# Patient Record
Sex: Male | Born: 1960 | Race: White | Hispanic: No | Marital: Married | Smoking: Never smoker
Health system: Southern US, Community
[De-identification: ages and names within clinical notes are randomized; demographics above are authoritative.]

## PROBLEM LIST (undated history)

## (undated) DIAGNOSIS — I428 Other cardiomyopathies: Principal | ICD-10-CM

## (undated) DIAGNOSIS — Z789 Other specified health status: Secondary | ICD-10-CM

## (undated) DIAGNOSIS — C61 Malignant neoplasm of prostate: Secondary | ICD-10-CM

## (undated) DIAGNOSIS — K219 Gastro-esophageal reflux disease without esophagitis: Secondary | ICD-10-CM

## (undated) HISTORY — PX: PROSTATE BIOPSY: SHX241

## (undated) HISTORY — DX: Other cardiomyopathies: I42.8

## (undated) HISTORY — PX: VASECTOMY: SHX75

---

## 1999-12-20 HISTORY — PX: VASECTOMY REVERSAL: SHX243

## 2000-08-01 ENCOUNTER — Encounter: Admission: RE | Admit: 2000-08-01 | Discharge: 2000-08-01 | Payer: Self-pay | Admitting: Occupational Medicine

## 2000-08-01 ENCOUNTER — Encounter: Payer: Self-pay | Admitting: Occupational Medicine

## 2002-10-09 ENCOUNTER — Encounter: Payer: Self-pay | Admitting: Family Medicine

## 2002-10-09 ENCOUNTER — Encounter: Admission: RE | Admit: 2002-10-09 | Discharge: 2002-10-09 | Payer: Self-pay | Admitting: Family Medicine

## 2004-10-18 ENCOUNTER — Encounter: Admission: RE | Admit: 2004-10-18 | Discharge: 2004-10-18 | Payer: Self-pay | Admitting: Occupational Medicine

## 2007-01-31 ENCOUNTER — Encounter: Admission: RE | Admit: 2007-01-31 | Discharge: 2007-01-31 | Payer: Self-pay | Admitting: Occupational Medicine

## 2009-05-04 ENCOUNTER — Encounter: Admission: RE | Admit: 2009-05-04 | Discharge: 2009-05-04 | Payer: Self-pay | Admitting: Internal Medicine

## 2013-06-28 ENCOUNTER — Other Ambulatory Visit: Payer: Self-pay | Admitting: Occupational Medicine

## 2013-06-28 ENCOUNTER — Ambulatory Visit: Payer: Self-pay

## 2013-06-28 DIAGNOSIS — Z0000 Encounter for general adult medical examination without abnormal findings: Secondary | ICD-10-CM

## 2014-04-22 ENCOUNTER — Other Ambulatory Visit: Payer: Self-pay | Admitting: Internal Medicine

## 2014-04-22 DIAGNOSIS — R109 Unspecified abdominal pain: Secondary | ICD-10-CM

## 2014-04-24 ENCOUNTER — Ambulatory Visit
Admission: RE | Admit: 2014-04-24 | Discharge: 2014-04-24 | Disposition: A | Discharge status: Home / Self Care | Payer: BC Managed Care – PPO | Source: Ambulatory Visit | Attending: Internal Medicine | Admitting: Internal Medicine

## 2014-04-24 DIAGNOSIS — R109 Unspecified abdominal pain: Secondary | ICD-10-CM

## 2014-05-06 ENCOUNTER — Encounter (INDEPENDENT_AMBULATORY_CARE_PROVIDER_SITE_OTHER): Payer: Self-pay | Admitting: General Surgery

## 2014-05-06 ENCOUNTER — Ambulatory Visit (INDEPENDENT_AMBULATORY_CARE_PROVIDER_SITE_OTHER): Payer: BC Managed Care – PPO | Admitting: General Surgery

## 2014-05-06 VITALS — BP 118/78 | HR 68 | Temp 98.1°F | Resp 18 | Ht 68.5 in | Wt 205.0 lb

## 2014-05-06 DIAGNOSIS — K7689 Other specified diseases of liver: Secondary | ICD-10-CM

## 2014-05-06 DIAGNOSIS — K8020 Calculus of gallbladder without cholecystitis without obstruction: Secondary | ICD-10-CM

## 2014-05-06 NOTE — Patient Instructions (Signed)
You have gallstones, and therefore you have a diseased gallbladder.  Your episodes of right upper quadrant pain and nausea are almost certainly due to your gallbladder. This will be a progressive problem over time.  You do have 2 benign appearing liver cysts, and nothing further needs to be done about that.  You will be scheduled for laparoscopic cholecystectomy with cholangiogram, possible open cholecystectomy in the near future.      Laparoscopic Cholecystectomy Laparoscopic cholecystectomy is surgery to remove the gallbladder. The gallbladder is located in the upper right part of the abdomen, behind the liver. It is a storage sac for bile produced in the liver. Bile aids in the digestion and absorption of fats. Cholecystectomy is often done for inflammation of the gallbladder (cholecystitis). This condition is usually caused by a buildup of gallstones (cholelithiasis) in your gallbladder. Gallstones can block the flow of bile, resulting in inflammation and pain. In severe cases, emergency surgery may be required. When emergency surgery is not required, you will have time to prepare for the procedure. Laparoscopic surgery is an alternative to open surgery. Laparoscopic surgery has a shorter recovery time. Your common bile duct may also need to be examined during the procedure. If stones are found in the common bile duct, they may be removed. LET [facility] CARE PROVIDER KNOW ABOUT:  Any allergies you have.  All medicines you are taking, including vitamins, herbs, eye drops, creams, and over-the-counter medicines.  Previous problems you or members of your family have had with the use of anesthetics.  Any blood disorders you have.  Previous surgeries you have had.  Medical conditions you have. RISKS AND COMPLICATIONS Generally, this is a safe procedure. However, as with any procedure, complications can occur. Possible complications include:  Infection.  Damage to the common bile  duct, nerves, arteries, veins, or other internal organs such as the stomach, liver, or intestines.  Bleeding.  A stone may remain in the common bile duct.  A bile leak from the cyst duct that is clipped when your gallbladder is removed.  The need to convert to open surgery, which requires a larger incision in the abdomen. This may be necessary if your surgeon thinks it is not safe to continue with a laparoscopic procedure. BEFORE THE PROCEDURE  Ask your health care provider about changing or stopping any regular medicines. You will need to stop taking aspirin or blood thinners at least 5 days prior to surgery.  Do not eat or drink anything after midnight the night before surgery.  Let your health care provider know if you develop a cold or other infectious problem before surgery. PROCEDURE   You will be given medicine to make you sleep through the procedure (general anesthetic). A breathing tube will be placed in your mouth.  When you are asleep, your surgeon will make several small cuts (incisions) in your abdomen.  A thin, lighted tube with a tiny camera on the end (laparoscope) is inserted through one of the small incisions. The camera on the laparoscope sends a picture to a TV screen in the operating room. This gives the surgeon a good view inside your abdomen.  A gas will be pumped into your abdomen. This expands your abdomen so that the surgeon has more room to perform the surgery.  Other tools needed for the procedure are inserted through the other incisions. The gallbladder is removed through one of the incisions.  After the removal of your gallbladder, the incisions will be closed with stitches, staples,  or skin glue. AFTER THE PROCEDURE  You will be taken to a recovery area where your progress will be checked often.  You may be allowed to go home the same day if your pain is controlled and you can tolerate liquids. Document Released: 12/05/2005 Document Revised: 09/25/2013  Document Reviewed: 07/17/2013 [facility] Patient Information 2014 [employer].

## 2014-05-06 NOTE — Progress Notes (Signed)
Patient ID: Charles [name], male   DOB: 1961/03/20, 53 y.o.   MRN: [id number]  Chief Complaint  Patient presents with  . New Evaluation    eval gallbladder and liver cyst    Note: This dictation was prepared with Dragon/digital dictation along with [employer]. Any transcriptional errors that result from this process are unintentional.  HPI Charles [name] is a 53 y.o. male.  He is referred by Dr. Kelton Pillar at [facility] for evaluation and management of symptomatic gallstones.   He is generally very healthy. He is here with his wife. In November he had an episode of severe right upper quadrant pain and nausea that lasted at least 4 hours. The change of bowel habits. Possibly related to eating red meat but he's not sure. No problems until recently when he had 2 other episodes, although no clot not quite as severe but still right upper quadrant and lasting a few hours and then resolving. CBC, liver profile, lipase, and urinalysis were normal recently. Ultrasound shows multiple gallstones, largest 23 mm. Wall thickness 4 mm. CBD 2 mm. 2 benign liver cyst, 10 mm each. He's asymptomatic today.  His only prior surgery is a vasectomy and a vasectomy reversal.  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 HPI  History reviewed. No pertinent past medical history.  Past Surgical History  Procedure Laterality Date  . Vasectomy      History reviewed. No pertinent family history.  Social History History  Substance Use Topics  . Smoking status: Never Smoker   . Smokeless tobacco: Never Used  . Alcohol Use: No    No Known Allergies  No current outpatient prescriptions on file.   No current facility-administered medications for this visit.    Review of Systems Review of Systems  Constitutional: Negative for fever, chills and unexpected weight change.  HENT: Negative for congestion, hearing loss, sore throat, trouble swallowing and voice change.    Eyes: Negative for visual disturbance.  Respiratory: Negative for cough and wheezing.   Cardiovascular: Negative for chest pain, palpitations and leg swelling.  Gastrointestinal: Positive for nausea and abdominal pain. Negative for vomiting, diarrhea, constipation, blood in stool, abdominal distention, anal bleeding and rectal pain.  Genitourinary: Negative for hematuria and difficulty urinating.  Musculoskeletal: Negative for arthralgias.  Skin: Negative for rash and wound.  Neurological: Negative for seizures, syncope, weakness and headaches.  Hematological: Negative for adenopathy. Does not bruise/bleed easily.  Psychiatric/Behavioral: Negative for confusion.    Blood pressure 118/78, pulse 68, temperature 98.1 F (36.7 C), resp. rate 18, height 5' 8.5" (1.74 m), weight 205 lb (92.987 kg).  Physical Exam Physical Exam  Constitutional: He is oriented to person, place, and time. He appears well-developed and well-nourished. No distress.  HENT:  Head: Normocephalic.  Nose: Nose normal.  Mouth/Throat: No oropharyngeal exudate.  Eyes: Conjunctivae and EOM are normal. Pupils are equal, round, and reactive to light. Right eye exhibits no discharge. Left eye exhibits no discharge. No scleral icterus.  Neck: Normal range of motion. Neck supple. No JVD present. No tracheal deviation present. No thyromegaly present.  Cardiovascular: Normal rate, regular rhythm, normal heart sounds and intact distal pulses.   No murmur heard. Pulmonary/Chest: Effort normal and breath sounds normal. No stridor. No respiratory distress. He has no wheezes. He has no rales. He exhibits no tenderness.  Abdominal: Soft. Bowel sounds are normal. He exhibits no distension and no mass. There is no tenderness. There is no rebound and no  guarding.  Mild right upper quadrant tenderness. Completely subjective. No mass. No guarding. No percussion tenderness.  Musculoskeletal: Normal range of motion. He exhibits no edema and  no tenderness.  Lymphadenopathy:    He has no cervical adenopathy.  Neurological: He is alert and oriented to person, place, and time. He has normal reflexes. Coordination normal.  Skin: Skin is warm and dry. No rash noted. He is not diaphoretic. No erythema. No pallor.  Psychiatric: He has a normal mood and affect. His behavior is normal. Judgment and thought content normal.    Data Reviewed Office note some Adalat 10 mom. Lab work. Ultrasound report.  Assessment    Chronic cholecystitis with cholelithiasis. Having fairly typical symptoms and is now had 3 episodes. He is at risk for progression of biliary colic, and possible complications in the future. Elective cholecystectomy is indicated and he would like to do that.  2 benign liver cysts. No further imaging or intervention indicated. I discussed this with the patient at some length.    Plan    We will schedule him for laparoscopic cholecystectomy with cholangiogram, possible open at his convenience. He states he would like to do this within the next 2 or 3 weeks because he has a big vacation planned in late June  I discussed the indications, details, techniques and numerous risk of surgery with him and his wife. Is aware of the risk of bleeding, infection, conversion to open laparotomy, wound hernia, bile leak, injury to adjacent organs with major reconstructive surgery, cardiac, pulmonary, and other unforeseen problems. He understands all these issues. All of his questions are answered. He agrees with this plan.        Edsel Petrin. Dalbert Batman, M.D., [facility] General and Minimally invasive Surgery Breast and Colorectal Surgery Office:   [phone] Pager:   [phone]  05/06/2014, 5:40 PM

## 2014-05-07 ENCOUNTER — Telehealth (INDEPENDENT_AMBULATORY_CARE_PROVIDER_SITE_OTHER): Payer: Self-pay | Admitting: General Surgery

## 2014-05-07 NOTE — Telephone Encounter (Signed)
per orders schedule surgery prior to 6/5 with assistant surgeon Please advise time you have available

## 2014-05-15 ENCOUNTER — Encounter (HOSPITAL_BASED_OUTPATIENT_CLINIC_OR_DEPARTMENT_OTHER): Payer: Self-pay | Admitting: *Deleted

## 2014-05-15 NOTE — Progress Notes (Signed)
Called dr Dalbert Batman to see if pcp labs ok done 04/22/14-he was ok with those

## 2014-05-21 NOTE — H&P (Signed)
PHU RECORD   MRN:  [id number]   Description: 53 year old male  Provider: Adin Hector, MD  Department: [facility]        Diagnoses      Gallstones    -  Primary      574.20      Benign liver cyst          573.8               Current Vitals Most recent update: 05/06/2014  5:01 PM by Dois Davenport, LPN      BP Pulse Temp(Src) Resp Ht Wt      118/78 68 98.1 F (36.7 C) 18 5' 8.5" (1.74 m) 205 lb (92.987 kg)      BMI 30.71 kg/m2                      History and Physical   Adin Hector, MD      Status: Signed            Patient ID: Charles [name], male   DOB: 05/12/1961, 53 y.o.   MRN: [id number]             Note:  This dictation was prepared with Dragon/digital dictation along with [facility] technology. Any transcriptional errors that result from this process are unintentional.   HPI Charles [name] is a 53 y.o. male.  He is referred by Dr. Kelton Pillar at [facility] for evaluation and management of symptomatic gallstones.    He is generally very healthy. He is here with his wife. In November he had an episode of severe right upper quadrant pain and nausea that lasted at least 4 hours. The change of bowel habits. Possibly related to eating red meat but he's not sure. No problems until recently when he had 2 other episodes, although no clot not quite as severe but still right upper quadrant and lasting a few hours and then resolving. CBC, liver profile, lipase, and urinalysis were normal recently. Ultrasound shows multiple gallstones, largest 23 mm. Wall thickness 4 mm. CBD 2 mm. 2 benign liver cyst, 10 mm each. He's asymptomatic today.   His only prior surgery is a vasectomy and a vasectomy reversal.   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      History reviewed. No pertinent past medical history.    Past Surgical History   Procedure  Laterality  Date   .  Vasectomy            History reviewed. No  pertinent family history.   Social History History   Substance Use Topics   .  Smoking status:  Never Smoker    .  Smokeless tobacco:  Never Used   .  Alcohol Use:  No        No Known Allergies    No current outpatient prescriptions on file.       No current facility-administered medications for this visit.        Review of Systems   Constitutional: Negative for fever, chills and unexpected weight change.  HENT: Negative for congestion, hearing loss, sore throat, trouble swallowing and voice change.   Eyes: Negative for visual disturbance.  Respiratory: Negative for cough and wheezing.   Cardiovascular: Negative for chest pain, palpitations and leg swelling.  Gastrointestinal: Positive for nausea and abdominal pain. Negative for vomiting, diarrhea, constipation, blood in stool, abdominal distention, anal bleeding and  rectal pain.  Genitourinary: Negative for hematuria and difficulty urinating.  Musculoskeletal: Negative for arthralgias.  Skin: Negative for rash and wound.  Neurological: Negative for seizures, syncope, weakness and headaches.  Hematological: Negative for adenopathy. Does not bruise/bleed easily.  Psychiatric/Behavioral: Negative for confusion.      Blood pressure 118/78, pulse 68, temperature 98.1 F (36.7 C), resp. rate 18, height 5' 8.5" (1.74 m), weight 205 lb (92.987 kg).   Physical Exam  Constitutional: He is oriented to person, place, and time. He appears well-developed and well-nourished. No distress.  HENT:   Head: Normocephalic.   Nose: Nose normal.   Mouth/Throat: No oropharyngeal exudate.  Eyes: Conjunctivae and EOM are normal. Pupils are equal, round, and reactive to light. Right eye exhibits no discharge. Left eye exhibits no discharge. No scleral icterus.  Neck: Normal range of motion. Neck supple. No JVD present. No tracheal deviation present. No thyromegaly present.  Cardiovascular: Normal rate, regular rhythm, normal heart  sounds and intact distal pulses.    No murmur heard. Pulmonary/Chest: Effort normal and breath sounds normal. No stridor. No respiratory distress. He has no wheezes. He has no rales. He exhibits no tenderness.  Abdominal: Soft. Bowel sounds are normal. He exhibits no distension and no mass. There is no tenderness. There is no rebound and no guarding.  Mild right upper quadrant tenderness. Completely subjective. No mass. No guarding. No percussion tenderness.  Musculoskeletal: Normal range of motion. He exhibits no edema and no tenderness.  Lymphadenopathy:    He has no cervical adenopathy.  Neurological: He is alert and oriented to person, place, and time. He has normal reflexes. Coordination normal.  Skin: Skin is warm and dry. No rash noted. He is not diaphoretic. No erythema. No pallor.  Psychiatric: He has a normal mood and affect. His behavior is normal. Judgment and thought content normal.      Data Reviewed Office note some Adalat 10 mom. Lab work. Ultrasound report.   Assessment    Chronic cholecystitis with cholelithiasis. Having fairly typical symptoms and is now had 3 episodes. He is at risk for progression of biliary colic, and possible complications in the future. Elective cholecystectomy is indicated and he would like to do that.   2 benign liver cysts. No further imaging or intervention indicated. I discussed this with the patient at some length.     Plan    We will schedule him for laparoscopic cholecystectomy with cholangiogram, possible open at his convenience. He states he would like to do this within the next 2 or 3 weeks because he has a big vacation planned in late June   I discussed the indications, details, techniques and numerous risk of surgery with him and his wife. Is aware of the risk of bleeding, infection, conversion to open laparotomy, wound hernia, bile leak, injury to adjacent organs with major reconstructive surgery, cardiac, pulmonary, and other  unforeseen problems. He understands all these issues. All of his questions are answered. He agrees with this plan.           Edsel Petrin. Dalbert Batman, M.D., [facility] General and Minimally invasive Surgery Breast and Colorectal Surgery Office:   [phone] Pager:   [phone]

## 2014-05-22 ENCOUNTER — Encounter (HOSPITAL_BASED_OUTPATIENT_CLINIC_OR_DEPARTMENT_OTHER): Payer: BC Managed Care – PPO | Admitting: Anesthesiology

## 2014-05-22 ENCOUNTER — Ambulatory Visit (HOSPITAL_COMMUNITY): Payer: BC Managed Care – PPO

## 2014-05-22 ENCOUNTER — Ambulatory Visit (HOSPITAL_BASED_OUTPATIENT_CLINIC_OR_DEPARTMENT_OTHER): Payer: BC Managed Care – PPO | Admitting: Anesthesiology

## 2014-05-22 ENCOUNTER — Ambulatory Visit (HOSPITAL_BASED_OUTPATIENT_CLINIC_OR_DEPARTMENT_OTHER)
Admission: RE | Admit: 2014-05-22 | Discharge: 2014-05-22 | Disposition: A | Discharge status: Home / Self Care | Payer: BC Managed Care – PPO | Source: Ambulatory Visit | Attending: General Surgery | Admitting: General Surgery

## 2014-05-22 ENCOUNTER — Encounter (HOSPITAL_BASED_OUTPATIENT_CLINIC_OR_DEPARTMENT_OTHER)
Admission: RE | Disposition: A | Discharge status: Home / Self Care | Payer: Self-pay | Source: Ambulatory Visit | Attending: General Surgery

## 2014-05-22 ENCOUNTER — Encounter (HOSPITAL_BASED_OUTPATIENT_CLINIC_OR_DEPARTMENT_OTHER): Payer: Self-pay | Admitting: *Deleted

## 2014-05-22 DIAGNOSIS — K8010 Calculus of gallbladder with chronic cholecystitis without obstruction: Secondary | ICD-10-CM

## 2014-05-22 DIAGNOSIS — K219 Gastro-esophageal reflux disease without esophagitis: Secondary | ICD-10-CM | POA: Insufficient documentation

## 2014-05-22 DIAGNOSIS — K8020 Calculus of gallbladder without cholecystitis without obstruction: Secondary | ICD-10-CM | POA: Diagnosis present

## 2014-05-22 HISTORY — DX: Other specified health status: Z78.9

## 2014-05-22 HISTORY — PX: CHOLECYSTECTOMY: SHX55

## 2014-05-22 HISTORY — DX: Gastro-esophageal reflux disease without esophagitis: K21.9

## 2014-05-22 SURGERY — LAPAROSCOPIC CHOLECYSTECTOMY WITH INTRAOPERATIVE CHOLANGIOGRAM
Anesthesia: General | Site: Abdomen

## 2014-05-22 MED ORDER — CHLORHEXIDINE GLUCONATE 4 % EX LIQD: 1.0000 "application " | Freq: Once | CUTANEOUS | Status: DC

## 2014-05-22 MED ORDER — DEXAMETHASONE SODIUM PHOSPHATE 4 MG/ML IJ SOLN
INTRAMUSCULAR | Status: DC | PRN
  Administered 2014-05-22: 10 mg via INTRAVENOUS

## 2014-05-22 MED ORDER — METOCLOPRAMIDE HCL 5 MG/ML IJ SOLN
INTRAMUSCULAR | Status: DC | PRN
  Administered 2014-05-22: 10 mg via INTRAVENOUS

## 2014-05-22 MED ORDER — OXYCODONE HCL 5 MG PO TABS
5.0000 mg | ORAL_TABLET | Freq: Once | ORAL | Status: AC | PRN
  Administered 2014-05-22: 5 mg via ORAL

## 2014-05-22 MED ORDER — GLYCOPYRROLATE 0.2 MG/ML IJ SOLN
INTRAMUSCULAR | Status: DC | PRN
  Administered 2014-05-22: 0.4 mg via INTRAVENOUS

## 2014-05-22 MED ORDER — NEOSTIGMINE METHYLSULFATE 10 MG/10ML IV SOLN
INTRAVENOUS | Status: DC | PRN
  Administered 2014-05-22: 3 mg via INTRAVENOUS

## 2014-05-22 MED ORDER — HYDROMORPHONE HCL PF 1 MG/ML IJ SOLN
0.2500 mg | INTRAMUSCULAR | Status: DC | PRN
  Administered 2014-05-22 (×4): 0.5 mg via INTRAVENOUS

## 2014-05-22 MED ORDER — ONDANSETRON HCL 4 MG/2ML IJ SOLN
INTRAMUSCULAR | Status: DC | PRN
  Administered 2014-05-22: 4 mg via INTRAVENOUS

## 2014-05-22 MED ORDER — FENTANYL CITRATE 0.05 MG/ML IJ SOLN: 50.0000 ug | INTRAMUSCULAR | Status: DC | PRN

## 2014-05-22 MED ORDER — BUPIVACAINE-EPINEPHRINE (PF) 0.5% -1:200000 IJ SOLN
INTRAMUSCULAR | Status: AC
  Filled 2014-05-22: qty 30

## 2014-05-22 MED ORDER — SODIUM CHLORIDE 0.9 % IR SOLN
Status: DC | PRN
  Administered 2014-05-22: 1

## 2014-05-22 MED ORDER — FENTANYL CITRATE 0.05 MG/ML IJ SOLN
INTRAMUSCULAR | Status: AC
  Filled 2014-05-22: qty 6

## 2014-05-22 MED ORDER — OXYCODONE HCL 5 MG/5ML PO SOLN: 5.0000 mg | Freq: Once | ORAL | Status: AC | PRN

## 2014-05-22 MED ORDER — CEFAZOLIN SODIUM-DEXTROSE 2-3 GM-% IV SOLR
INTRAVENOUS | Status: AC
  Filled 2014-05-22: qty 50

## 2014-05-22 MED ORDER — MIDAZOLAM HCL 2 MG/2ML IJ SOLN
1.0000 mg | INTRAMUSCULAR | Status: DC | PRN
  Administered 2014-05-22: 2 mg via INTRAVENOUS

## 2014-05-22 MED ORDER — LACTATED RINGERS IV SOLN
INTRAVENOUS | Status: DC
  Administered 2014-05-22 (×2): via INTRAVENOUS

## 2014-05-22 MED ORDER — ROCURONIUM BROMIDE 100 MG/10ML IV SOLN
INTRAVENOUS | Status: DC | PRN
  Administered 2014-05-22: 50 mg via INTRAVENOUS

## 2014-05-22 MED ORDER — HYDROCODONE-ACETAMINOPHEN 5-325 MG PO TABS: 1.0000 | ORAL_TABLET | Freq: Four times a day (QID) | ORAL | Status: DC | PRN

## 2014-05-22 MED ORDER — PROPOFOL 10 MG/ML IV BOLUS
INTRAVENOUS | Status: DC | PRN
  Administered 2014-05-22: 200 mg via INTRAVENOUS

## 2014-05-22 MED ORDER — LIDOCAINE HCL (CARDIAC) 20 MG/ML IV SOLN
INTRAVENOUS | Status: DC | PRN
  Administered 2014-05-22: 80 mg via INTRAVENOUS

## 2014-05-22 MED ORDER — HYDROMORPHONE HCL PF 1 MG/ML IJ SOLN
INTRAMUSCULAR | Status: AC
  Filled 2014-05-22: qty 1

## 2014-05-22 MED ORDER — FENTANYL CITRATE 0.05 MG/ML IJ SOLN
INTRAMUSCULAR | Status: DC | PRN
  Administered 2014-05-22 (×2): 100 ug via INTRAVENOUS

## 2014-05-22 MED ORDER — PHENYLEPHRINE HCL 10 MG/ML IJ SOLN
INTRAMUSCULAR | Status: DC | PRN
  Administered 2014-05-22 (×2): 40 ug via INTRAVENOUS
  Administered 2014-05-22: 80 ug via INTRAVENOUS

## 2014-05-22 MED ORDER — BUPIVACAINE-EPINEPHRINE 0.5% -1:200000 IJ SOLN
INTRAMUSCULAR | Status: DC | PRN
  Administered 2014-05-22: 12 mL

## 2014-05-22 MED ORDER — CEFAZOLIN SODIUM-DEXTROSE 2-3 GM-% IV SOLR
2.0000 g | INTRAVENOUS | Status: AC
  Administered 2014-05-22: 2 g via INTRAVENOUS

## 2014-05-22 MED ORDER — IOHEXOL 300 MG/ML  SOLN
INTRAMUSCULAR | Status: DC | PRN
  Administered 2014-05-22: 08:00:00

## 2014-05-22 MED ORDER — MIDAZOLAM HCL 2 MG/2ML IJ SOLN
INTRAMUSCULAR | Status: AC
  Filled 2014-05-22: qty 2

## 2014-05-22 MED ORDER — OXYCODONE HCL 5 MG PO TABS
ORAL_TABLET | ORAL | Status: AC
  Filled 2014-05-22: qty 1

## 2014-05-22 SURGICAL SUPPLY — 42 items
ADH SKN CLS APL DERMABOND .7 (GAUZE/BANDAGES/DRESSINGS) ×1
APPLIER CLIP ROT 10 11.4 M/L (STAPLE) ×3
APR CLP MED LRG 11.4X10 (STAPLE) ×1
BAG SPEC RTRVL LRG 6X4 10 (ENDOMECHANICALS) ×1
BLADE SURG ROTATE 9660 (MISCELLANEOUS) ×2 IMPLANT
CANISTER SUCT 1200ML W/VALVE (MISCELLANEOUS) ×3 IMPLANT
CHLORAPREP W/TINT 26ML (MISCELLANEOUS) ×3 IMPLANT
CLIP APPLIE ROT 10 11.4 M/L (STAPLE) ×1 IMPLANT
COVER MAYO STAND STRL (DRAPES) ×2 IMPLANT
DECANTER SPIKE VIAL GLASS SM (MISCELLANEOUS) ×3 IMPLANT
DERMABOND ADVANCED (GAUZE/BANDAGES/DRESSINGS) ×2
DERMABOND ADVANCED .7 DNX12 (GAUZE/BANDAGES/DRESSINGS) ×1 IMPLANT
DRAPE C-ARM 42X72 X-RAY (DRAPES) ×2 IMPLANT
DRAPE UTILITY XL STRL (DRAPES) ×3 IMPLANT
ELECT REM PT RETURN 9FT ADLT (ELECTROSURGICAL) ×3
ELECTRODE REM PT RTRN 9FT ADLT (ELECTROSURGICAL) ×1 IMPLANT
FILTER SMOKE EVAC LAPAROSHD (FILTER) IMPLANT
GLOVE BIOGEL PI IND STRL 7.5 (GLOVE) IMPLANT
GLOVE BIOGEL PI INDICATOR 7.5 (GLOVE) ×2
GLOVE EUDERMIC 7 POWDERFREE (GLOVE) ×3 IMPLANT
GLOVE SURG SS PI 7.0 STRL IVOR (GLOVE) ×2 IMPLANT
GLOVE SURG SS PI 7.5 STRL IVOR (GLOVE) ×2 IMPLANT
GOWN STRL REUS W/ TWL LRG LVL3 (GOWN DISPOSABLE) ×2 IMPLANT
GOWN STRL REUS W/ TWL XL LVL3 (GOWN DISPOSABLE) ×1 IMPLANT
GOWN STRL REUS W/TWL LRG LVL3 (GOWN DISPOSABLE) ×6
GOWN STRL REUS W/TWL XL LVL3 (GOWN DISPOSABLE) ×3
HEMOSTAT SNOW SURGICEL 2X4 (HEMOSTASIS) IMPLANT
NS IRRIG 1000ML POUR BTL (IV SOLUTION) IMPLANT
PACK BASIN DAY SURGERY FS (CUSTOM PROCEDURE TRAY) ×3 IMPLANT
POUCH SPECIMEN RETRIEVAL 10MM (ENDOMECHANICALS) ×3 IMPLANT
SCISSORS LAP 5X35 DISP (ENDOMECHANICALS) IMPLANT
SET CHOLANGIOGRAPH 5 50 .035 (SET/KITS/TRAYS/PACK) IMPLANT
SET IRRIG TUBING LAPAROSCOPIC (IRRIGATION / IRRIGATOR) ×3 IMPLANT
SLEEVE ENDOPATH XCEL 5M (ENDOMECHANICALS) ×3 IMPLANT
SLEEVE SCD COMPRESS KNEE MED (MISCELLANEOUS) ×3 IMPLANT
SUT MNCRL AB 4-0 PS2 18 (SUTURE) ×3 IMPLANT
SUT VICRYL 0 UR6 27IN ABS (SUTURE) ×2 IMPLANT
TOWEL OR 17X24 6PK STRL BLUE (TOWEL DISPOSABLE) ×3 IMPLANT
TRAY LAPAROSCOPIC (CUSTOM PROCEDURE TRAY) ×3 IMPLANT
TROCAR XCEL BLUNT TIP 100MML (ENDOMECHANICALS) ×3 IMPLANT
TROCAR XCEL NON-BLD 11X100MML (ENDOMECHANICALS) ×3 IMPLANT
TROCAR XCEL NON-BLD 5MMX100MML (ENDOMECHANICALS) ×3 IMPLANT

## 2014-05-22 NOTE — Anesthesia Postprocedure Evaluation (Signed)
  Anesthesia Post-op Note  Patient: Charles [name]  Procedure(s) Performed: Procedure(s): LAPAROSCOPIC CHOLECYSTECTOMY WITH INTRAOPERATIVE CHOLANGIOGRAM (N/A)  Patient Location: PACU  Anesthesia Type:General  Level of Consciousness: awake and alert   Airway and Oxygen Therapy: Patient Spontanous Breathing  Post-op Pain: moderate  Post-op Assessment: Post-op Vital signs reviewed, Patient's Cardiovascular Status Stable and Respiratory Function Stable  Post-op Vital Signs: Reviewed  Filed Vitals:   05/22/14 0945  BP: 106/60  Pulse: 71  Temp:   Resp: 13    Complications: No apparent anesthesia complications

## 2014-05-22 NOTE — Anesthesia Preprocedure Evaluation (Addendum)
Anesthesia Evaluation  Patient identified by MRN, date of birth, ID band Patient awake    Reviewed: Allergy & Precautions, H&P , NPO status , Patient's Chart, lab work & pertinent test results  Airway Mallampati: I TM Distance: >3 FB Neck ROM: Full    Dental no notable dental hx. (+) Teeth Intact, Dental Advisory Given,    Pulmonary neg pulmonary ROS,  breath sounds clear to auscultation  Pulmonary exam normal       Cardiovascular Exercise Tolerance: Good negative cardio ROS  Rhythm:Regular Rate:Normal     Neuro/Psych negative neurological ROS  negative psych ROS   GI/Hepatic Neg liver ROS, GERD-  Medicated and Controlled,  Endo/Other  negative endocrine ROS  Renal/GU negative Renal ROS  negative genitourinary   Musculoskeletal negative musculoskeletal ROS (+)   Abdominal   Peds  Hematology negative hematology ROS (+)   Anesthesia Other Findings   Reproductive/Obstetrics negative OB ROS                         Anesthesia Physical Anesthesia Plan  ASA: II  Anesthesia Plan: General   Post-op Pain Management:    Induction: Intravenous  Airway Management Planned: Oral ETT  Additional Equipment:   Intra-op Plan:   Post-operative Plan: Extubation in OR  Informed Consent: I have reviewed the patients History and Physical, chart, labs and discussed the procedure including the risks, benefits and alternatives for the proposed anesthesia with the patient or authorized representative who has indicated his/her understanding and acceptance.   Dental advisory given  Plan Discussed with: CRNA and Anesthesiologist  Anesthesia Plan Comments:        Anesthesia Quick Evaluation

## 2014-05-22 NOTE — Anesthesia Procedure Notes (Signed)
Procedure Name: Intubation Date/Time: 05/22/2014 7:29 AM Performed by: Wanita Chamberlain Pre-anesthesia Checklist: Patient identified, Timeout performed, Emergency Drugs available, Suction available and Patient being monitored Patient Re-evaluated:Patient Re-evaluated prior to inductionOxygen Delivery Method: Circle system utilized Preoxygenation: Pre-oxygenation with 100% oxygen Intubation Type: IV induction Ventilation: Mask ventilation without difficulty and Oral airway inserted - appropriate to patient size Laryngoscope Size: Mac and 3 Grade View: Grade II Tube type: Oral Tube size: 8.0 mm Number of attempts: 2 Airway Equipment and Method: Stylet Placement Confirmation: ETT inserted through vocal cords under direct vision,  positive ETCO2 and breath sounds checked- equal and bilateral Secured at: 22 cm Tube secured with: Tape Dental Injury: Teeth and Oropharynx as per pre-operative assessment

## 2014-05-22 NOTE — Op Note (Signed)
Patient Name:           Charles [name]   Date of Surgery:        05/22/2014  Note: This dictation was prepared with Dragon/digital dictation along with [facility] technology. Any transcriptional errors that result from this process are unintentional.  Pre op Diagnosis:      Chronic cholecystitis with cholelithiasis  Post op Diagnosis:    Same  Procedure:                 Laparoscopic cholecystectomy with cholangiogram  Surgeon:                     Edsel Petrin. Dalbert Batman, M.D., FACS  Assistant:                      None  Operative Indications:   Charles [name] is a 53 y.o. male. He is referred by Dr. Kelton Pillar at [facility] for evaluation and management of symptomatic gallstones.  He is generally very healthy. . In November he had an episode of severe right upper quadrant pain and nausea that lasted at least 4 hours. No change of bowel habits. Possibly related to eating red meat but he's not sure. No problems until recently when he had 2 other episodes, although  not quite as severe but still right upper quadrant and lasting a few hours and then resolving. CBC, liver profile, lipase, and urinalysis were normal recently. Ultrasound shows multiple gallstones, largest 23 mm. Wall thickness 4 mm. CBD 2 mm. 2 benign liver cyst, 10 mm each. He's asymptomatic today. He is brought to the operating room electively   Operative Findings:       The gallbladder was thin-walled but had lots of adhesions to it and was chronically inflamed. The cystic duct was very tiny but was patent. The cholangiogram was normal, showing normal intrahepatic and extrahepatic biliary anatomy, no filling defects, and no obstruction with good flow of contrast into the duodenum. The stomach, duodenum, small bowel, large bowel and liver looked normal.  Procedure in Detail:          Following the induction of general endotracheal anesthesia the patient's abdomen was prepped and draped in a sterile fashion, intravenous  antibiotics were given, surgical time out was performed, and 0.5% Marcaine with epinephrine was used as local infiltration anesthetic. A vertical incision was made at the lower rim of the umbilicus. The fascia was incised in the midline and the abdominal cavity entered under direct vision. 11 mm Hassan trocar was inserted and secured with the pursestring suture of 0 Vicryl. Pneumoperitoneum was created and video  camera inserted. An 11 mm trocar was placed in the subxiphoid region and two 5 mm trocars placed the right upper quadrant. The gallbladder fundus was grasped and elevated.   I spent some time taking down chronic adhesions. I dissected out the infundibulum of the gallbladder and retracted it laterally. I then dissected the peritoneum off the neck of the gallbladder and carefully dissected out the cystic duct and cystic artery. I created a window behind both structures. I could see the common bile duct. A cholangiogram catheter was inserted into the cystic duct and a cholangiogram was obtained with the C-arm. The cholangiogram was normal as described above. The Cholangiocath was removed, the cystic duct was secured with multiple hemoclips and divided. The cystic artery was secured with multiple metal clips and divided. The gallbladder was dissected from its bed with electrocautery placed  in a specimen bag and removed. The operative field was  copiously irrigated. Irrigation fluid became clear. There was no bleeding or bile leak. The trocars were removed and there was no bleeding from the trocar sites. Pneumoperitoneum was released. The fascia at the umbilicus was closed with 0 Vicryl sutures. The skin incisions were closed with subcuticular sutures of 4-0 Monocryl and Dermabond. The patient tolerated the procedure well taken to PACU in stable condition. EBL 15 cc. Counts correct. Complications none.     Edsel Petrin. Dalbert Batman, M.D., FACS General and Minimally Invasive Surgery Breast and Colorectal  Surgery  05/22/2014 8:47 AM

## 2014-05-22 NOTE — Interval H&P Note (Signed)
History and Physical Interval Note:  05/22/2014 6:32 AM  Charles [name]  has presented today for surgery, with the diagnosis of GALL STONES   The various methods of treatment have been discussed with the patient and family. After consideration of risks, benefits and other options for treatment, the patient has consented to  Procedure(s): LAPAROSCOPIC CHOLECYSTECTOMY WITH INTRAOPERATIVE CHOLANGIOGRAM (N/A) as a surgical intervention .  The patient's history has been reviewed, patient examined, no change in status, stable for surgery.  I have reviewed the patient's chart and labs.  Questions were answered to the patient's satisfaction.     Adin Hector

## 2014-05-22 NOTE — Transfer of Care (Signed)
Immediate Anesthesia Transfer of Care Note  Patient: Charles [name]  Procedure(s) Performed: Procedure(s): LAPAROSCOPIC CHOLECYSTECTOMY WITH INTRAOPERATIVE CHOLANGIOGRAM (N/A)  Patient Location: PACU  Anesthesia Type:General  Level of Consciousness: awake, alert , oriented and patient cooperative  Airway & Oxygen Therapy: Patient Spontanous Breathing and Patient connected to face mask oxygen  Post-op Assessment: Report given to PACU RN and Post -op Vital signs reviewed and stable  Post vital signs: Reviewed and stable  Complications: No apparent anesthesia complications

## 2014-05-22 NOTE — Discharge Instructions (Signed)
[facility] ______[facility] °LAPAROSCOPIC SURGERY: POST OP INSTRUCTIONS °Always review your discharge instruction sheet given to you by the facility where your surgery was performed. °IF YOU HAVE DISABILITY OR FAMILY LEAVE FORMS, YOU MUST BRING THEM TO THE OFFICE FOR PROCESSING.   °DO NOT GIVE THEM TO YOUR DOCTOR. ° °1. A prescription for pain medication may be given to you upon discharge.  Take your pain medication as prescribed, if needed.  If narcotic pain medicine is not needed, then you may take acetaminophen (Tylenol) or ibuprofen (Advil) as needed. °2. Take your usually prescribed medications unless otherwise directed. °3. If you need a refill on your pain medication, please contact your pharmacy.  They will contact our office to request authorization. Prescriptions will not be filled after 5pm or on week-ends. °4. You should follow a light diet the first few days after arrival home, such as soup and crackers, etc.  Be sure to include lots of fluids daily. °5. Most patients will experience some swelling and bruising in the area of the incisions.  Ice packs will help.  Swelling and bruising can take several days to resolve.  °6. It is common to experience some constipation if taking pain medication after surgery.  Increasing fluid intake and taking a stool softener (such as Colace) will usually help or prevent this problem from occurring.  A mild laxative (Milk of Magnesia or Miralax) should be taken according to package instructions if there are no bowel movements after 48 hours. °7. Unless discharge instructions indicate otherwise, you may remove your bandages 24-48 hours after surgery, and you may shower at that time.  You may have steri-strips (small skin tapes) in place directly over the incision.  These strips should be left on the skin for 7-10 days.  If your surgeon used skin glue on the incision, you may shower in 24 hours.  The glue will flake off over the next 2-3 weeks.  Any sutures or  staples will be removed at the office during your follow-up visit. °8. ACTIVITIES:  You may resume regular (light) daily activities beginning the next day--such as daily self-care, walking, climbing stairs--gradually increasing activities as tolerated.  You may have sexual intercourse when it is comfortable.  Refrain from any heavy lifting or straining until approved by your doctor. °a. You may drive when you are no longer taking prescription pain medication, you can comfortably wear a seatbelt, and you can safely maneuver your car and apply brakes. °b. RETURN TO WORK:  __________________________________________________________ °9. You should see your doctor in the office for a follow-up appointment approximately 2-3 weeks after your surgery.  Make sure that you call for this appointment within a day or two after you arrive home to insure a convenient appointment time. °10. OTHER INSTRUCTIONS: __________________________________________________________________________________________________________________________ __________________________________________________________________________________________________________________________ °WHEN TO CALL YOUR DOCTOR: °1. Fever over 101.0 °2. Inability to urinate °3. Continued bleeding from incision. °4. Increased pain, redness, or drainage from the incision. °5. Increasing abdominal pain ° °The clinic staff is available to answer your questions during regular business hours.  Please don’t hesitate to call and ask to speak to one of the nurses for clinical concerns.  If you have a medical emergency, go to the nearest emergency room or call 911.  A surgeon from [facility] is always on call at the hospital. °[address], [address], [place], [place]  [address] ? [address] [address], [place],    [address] °[phone] ? [phone] ? FAX [phone] °Web site:   [email or web address]   Post Anesthesia Home Care Instructions  Activity: Get  plenty of rest for the remainder of the day. A responsible adult should stay with you for 24 hours following the procedure.  For the next 24 hours, DO NOT: -Drive a car -Paediatric nurse -Drink alcoholic beverages -Take any medication unless instructed by your physician -Make any legal decisions or sign important papers.  Meals: Start with liquid foods such as gelatin or soup. Progress to regular foods as tolerated. Avoid greasy, spicy, heavy foods. If nausea and/or vomiting occur, drink only clear liquids until the nausea and/or vomiting subsides. Call your physician if vomiting continues.  Special Instructions/Symptoms: Your throat may feel dry or sore from the anesthesia or the breathing tube placed in your throat during surgery. If this causes discomfort, gargle with warm salt water. The discomfort should disappear within 24 hours.    Post Anesthesia Home Care Instructions  Activity: Get plenty of rest for the remainder of the day. A responsible adult should stay with you for 24 hours following the procedure.  For the next 24 hours, DO NOT: -Drive a car -Paediatric nurse -Drink alcoholic beverages -Take any medication unless instructed by your physician -Make any legal decisions or sign important papers.  Meals: Start with liquid foods such as gelatin or soup. Progress to regular foods as tolerated. Avoid greasy, spicy, heavy foods. If nausea and/or vomiting occur, drink only clear liquids until the nausea and/or vomiting subsides. Call your physician if vomiting continues.  Special Instructions/Symptoms: Your throat may feel dry or sore from the anesthesia or the breathing tube placed in your throat during surgery. If this causes discomfort, gargle with warm salt water. The discomfort should disappear within 24 hours.

## 2014-05-26 ENCOUNTER — Encounter (HOSPITAL_BASED_OUTPATIENT_CLINIC_OR_DEPARTMENT_OTHER): Payer: Self-pay | Admitting: General Surgery

## 2014-06-16 ENCOUNTER — Encounter (INDEPENDENT_AMBULATORY_CARE_PROVIDER_SITE_OTHER): Payer: Self-pay | Admitting: General Surgery

## 2014-06-16 ENCOUNTER — Encounter (INDEPENDENT_AMBULATORY_CARE_PROVIDER_SITE_OTHER): Payer: Self-pay

## 2014-06-16 ENCOUNTER — Ambulatory Visit (INDEPENDENT_AMBULATORY_CARE_PROVIDER_SITE_OTHER): Payer: BC Managed Care – PPO | Admitting: General Surgery

## 2014-06-16 VITALS — BP 122/60 | HR 70 | Temp 98.0°F | Resp 18 | Ht 70.0 in | Wt 208.0 lb

## 2014-06-16 DIAGNOSIS — K8020 Calculus of gallbladder without cholecystitis without obstruction: Secondary | ICD-10-CM

## 2014-06-16 NOTE — Progress Notes (Signed)
Patient ID: Charles [name], male   DOB: [birth year], 53 y.o.   MRN: [id number] History: This gentleman underwent laparoscopic cholecystectomy with cholangiogram on 05/22/2014. He is doing well. Normal appetite. Normal bowel function. A little incisional pain but no digestive problems.  Exam: The patient looks well. His wife is with him. Abdomen soft and nontender. All trocar sites have healed well  Assessment: Chronic cholecystitis with cholelithiasis, uneventful recovery following laparoscopic cholecystectomy  Plan: Return to work without restrictions July 7 Pathology report discussed and given to patient Low fat diet Return to see me if necessary.    Edsel Petrin. Dalbert Batman, M.D., [facility] General and Minimally invasive Surgery Breast and Colorectal Surgery Office:   [phone] Pager:   [phone]

## 2014-06-16 NOTE — Patient Instructions (Signed)
You have recovered from your gallbladder surgery without any surgical complications.  Low-fat diet is advised  You may return to work without restriction on July 6  Return to see Dr. Dalbert Batman if needed.

## 2015-09-21 ENCOUNTER — Ambulatory Visit: Payer: Self-pay

## 2015-09-21 ENCOUNTER — Other Ambulatory Visit: Payer: Self-pay | Admitting: Occupational Medicine

## 2015-09-21 DIAGNOSIS — Z0000 Encounter for general adult medical examination without abnormal findings: Secondary | ICD-10-CM

## 2015-10-28 ENCOUNTER — Other Ambulatory Visit (HOSPITAL_COMMUNITY): Payer: Self-pay | Admitting: Internal Medicine

## 2015-10-28 DIAGNOSIS — I517 Cardiomegaly: Secondary | ICD-10-CM

## 2015-11-03 ENCOUNTER — Other Ambulatory Visit: Payer: Self-pay

## 2015-11-03 ENCOUNTER — Ambulatory Visit (HOSPITAL_COMMUNITY): Payer: BLUE CROSS/BLUE SHIELD | Attending: Cardiology

## 2015-11-03 DIAGNOSIS — I340 Nonrheumatic mitral (valve) insufficiency: Secondary | ICD-10-CM | POA: Diagnosis not present

## 2015-11-03 DIAGNOSIS — I517 Cardiomegaly: Secondary | ICD-10-CM | POA: Insufficient documentation

## 2015-11-03 DIAGNOSIS — I071 Rheumatic tricuspid insufficiency: Secondary | ICD-10-CM | POA: Insufficient documentation

## 2015-11-10 ENCOUNTER — Encounter: Payer: Self-pay | Admitting: Cardiology

## 2015-12-10 ENCOUNTER — Encounter: Payer: Self-pay | Admitting: Cardiology

## 2015-12-10 DIAGNOSIS — I428 Other cardiomyopathies: Secondary | ICD-10-CM

## 2015-12-10 HISTORY — DX: Other cardiomyopathies: I42.8

## 2015-12-10 NOTE — Progress Notes (Signed)
Patient ID: Charles [name], male   DOB: 1961/09/16, 54 y.o.   MRN: [id number]   Charles [name]    Date of visit:  12/10/2015 DOB:  24-Oct-1961    Age:  54 yrs. Medical record number:  [id number]     Account number:  [id number] Primary Care Provider: SHAMLEFFER,IBTEHAL ____________________________ CURRENT DIAGNOSES  1. Cardiomyopathy, unspecified  2. Abnormal electrocardiogram [ECG] [EKG]  3. Gastro-esophageal reflux disease ____________________________ ALLERGIES  No Known Allergies ____________________________ MEDICATIONS  1. omeprazole 40 mg capsule,delayed release, PRN  2. losartan 50 mg tablet, 1 p.o. daily  3. metoprolol succinate ER 50 mg tablet,extended release 24 hr, 1 p.o. daily ____________________________ CHIEF COMPLAINTS  Followup of Abnormal electrocardiogram [ECG] [EKG]  Followup of Cardiomyopathy, unspecified ____________________________ HISTORY OF PRESENT ILLNESS Patient seen for cardiac followup. He returns today and is feeling fine. He is asymptomatic and he can do whatever he wants to do at work. He has no shortness of breath and denies chest pain. He has no PND, orthopnea or edema. He brings in a long list of questions about cardiomyopathy that we discussed. He also has significant hyperlipidemia. His previous myocardial perfusion scan showed an EF of 42% similar to the previous echo that he had. There is no ischemia noted. ____________________________ PAST HISTORY  Past Medical Illnesses:  pneumonia, GERD, denies hypertension or diabetes;  Cardiovascular Illnesses:  no previous history of cardiac disease.;  Surgical Procedures:  cholecystectomy (lap), vasectomy;  NYHA Classification:  I;  Canadian Angina Classification:  Class 0: Asymptomatic;  Cardiology Procedures-Invasive:  no history of prior cardiac procedures;  Cardiology Procedures-Noninvasive:  echocardiogram November 2016;  LVEF of 42% documented via nuclear study on 11/26/2015,    ____________________________ CARDIO-PULMONARY TEST DATES EKG Date:  11/10/2015;  Nuclear Study Date:  11/26/2015;  Echocardiography Date: 11/03/2015;   ____________________________ FAMILY HISTORY Brother -- Brother dead, Liver disease Brother -- Brother dead Brother -- Brother alive and well Brother -- Brother dead, Brother dead Father -- Family history unknown Mother -- Mother dead Sister -- Sister alive and well Sister -- Sister alive and well ____________________________ SOCIAL HISTORY Alcohol Use:  no alcohol use;  Smoking:  never smoked;  Diet:  regular diet;  Lifestyle:  divorced and remarried;  Exercise:  no regular exercise;  Occupation:  Orthoptist;  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 Weight:  190.00 lbs. Height:  68"BMI: 29  Constitutional:  pleasant white male in no acute distress, mildly obese Skin:  warm and dry to touch, no apparent skin lesions, or masses noted. Head:  normocephalic, balding male hair pattern Neck:  supple, without massess. No JVD, thyromegaly or carotid bruits. Carotid upstroke normal. Chest:  normal symmetry, clear to auscultation. Cardiac:  regular rhythm, normal S1 and S2, No S3 or S4, no murmurs, gallops or rubs detected. Extremities & Back:  no deformities, clubbing, cyanosis, erythema or edema observed. Normal muscle strength and tone. Neurological:  no gross motor or sensory deficits noted, affect appropriate, oriented x3. ____________________________ IMPRESSIONS/PLAN  1. 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 Added metoprolol extended release 50 mg to take one half tablet daily.  Recommended that he have a coronary CTA in light of his marked hyperlipidemia and cardiomyopathy  to be sure that he does not have significant underlying coronary artery disease as a cause. followup afterwards.  ____________________________ TODAYS ORDERS  1. Cardiac CTA: First Available  2. Basic Metabolic Panel: Today  3. Return Visit: 1 month                       ____________________________ Cardiology Physician:  Kerry Hough MD [facility]

## 2015-12-10 NOTE — Progress Notes (Signed)
Patient ID: Charles [name], male   DOB: [birth year], 54 y.o.   MRN: [id number]   Charles, [name]    Date of visit:  11/10/2015 DOB:  [birth year]    Age:  54 yrs. Medical record number:  [id number]     Account number:  [id number] Primary Care Provider: SHAMLEFFER,IBTEHAL ____________________________ CURRENT DIAGNOSES  1. Cardiomyopathy, unspecified  2. Abnormal electrocardiogram [ECG] [EKG]  3. Gastro-esophageal reflux disease ____________________________ ALLERGIES  No Known Allergies ____________________________ MEDICATIONS  1. omeprazole 40 mg capsule,delayed release, PRN  2. losartan 50 mg tablet, 1 p.o. daily ____________________________ CHIEF COMPLAINTS  Told abd ekg ____________________________ HISTORY OF PRESENT ILLNESS This very nice 54 year old male seen for evaluation of cardiomyopathy. The patient has been in good medical health. He recently underwent a routine physical and was found to have cardiomegaly. As a result he had an echocardiogram done recently that showed an ejection fraction between 40 and 45% with some element of diastolic dysfunction. The patient is asymptomatic. He does not have any chest pain suggestive of angina and denies shortness of breath or exercise intolerance. He has no edema. He denies PND, orthopnea, syncope, or claudication. There is no premature family history of cardiovascular disease that he is aware of. He is a nonsmoker. He did have severe pneumonia when he was around 24  and his wife states that it took him about a year to get over that. He has not had any recent viral infections. Recent lab was otherwise unremarkable. ____________________________ PAST HISTORY  Past Medical Illnesses:  pneumonia, GERD, denies hypertension or diabetes;  Cardiovascular Illnesses:  no previous history of cardiac disease.;  Surgical Procedures:  cholecystectomy (lap), vasectomy;  NYHA Classification:  I;  Canadian Angina Classification:  Class 0: Asymptomatic;  Cardiology  Procedures-Invasive:  no history of prior cardiac procedures;  Cardiology Procedures-Noninvasive:  echocardiogram November 2016;  LVEF of 45% documented via echocardiogram on 11/03/2015,   ____________________________ CARDIO-PULMONARY TEST DATES EKG Date:  11/10/2015;  Echocardiography Date: 11/03/2015;   ____________________________ FAMILY HISTORY Brother -- Brother dead, Liver disease Brother -- Brother dead Brother -- Brother alive and well Brother -- Brother dead, Brother dead Father -- Family history unknown Mother -- Mother dead Sister -- Sister alive and well Sister -- Sister alive and well ____________________________ SOCIAL HISTORY Alcohol Use:  no alcohol use;  Smoking:  never smoked;  Diet:  regular diet;  Lifestyle:  divorced and remarried;  Exercise:  no regular exercise;  Occupation:  Orthoptist;  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 Eyes: wears eye glasses/contact lenses, denies diplopia, glaucoma or visual field defects. Ears, Nose, Throat, Mouth:  denies any hearing loss, epistaxis, hoarseness or difficulty speaking. Respiratory: denies dyspnea, cough, wheezing or hemoptysis. Cardiovascular:  please review HPI Abdominal: denies dyspepsia, GI bleeding, constipation, or diarrhea Genitourinary-Male: no dysuria, urgency, frequency, or nocturia  Musculoskeletal:  arthritis of the ankles Neurological:  denies headaches, stroke, or TIA Psychiatric:  denies depression or anxiety Hematological/Immunologic:  denies any food allergies, bleeding disorders. ____________________________ PHYSICAL EXAMINATION VITAL SIGNS  Blood Pressure:  104/60 Sitting, Right arm, regular cuff  , 104/64 Sitting, Right arm and regular cuff   Pulse:  76/min. Weight:  202.00 lbs. Height:  68"BMI: 30  Constitutional:  pleasant white  male in no acute distress, mildly obese Skin:  warm and dry to touch, port wine stains over left upper arm  Head:  normocephalic, balding male hair pattern Eyes:  EOMS Intact, PERRLA, C and S clear, Funduscopic exam not done. ENT:  ears, nose and throat reveal no gross abnormalities.  Dentition good. Neck:  supple, without massess. No JVD, thyromegaly or carotid bruits. Carotid upstroke normal. Chest:  normal symmetry, clear to auscultation. Cardiac:  regular rhythm, normal S1 and S2, No S3 or S4, no murmurs, gallops or rubs detected. Abdomen:  abdomen soft,non-tender, no masses, no hepatospenomegaly, or aneurysm noted Peripheral Pulses:  the femoral,dorsalis pedis, and posterior tibial pulses are full and equal bilaterally with no bruits auscultated. Extremities & Back:  no deformities, clubbing, cyanosis, erythema or edema observed. Normal muscle strength and tone. Neurological:  no gross motor or sensory deficits noted, affect appropriate, oriented x3. ____________________________ IMPRESSIONS/PLAN  1. Cardiomyopathy with global hypokinesis and what I would estimate his ejection fraction of around 45%. I personally reviewed the echo images from the hospital 2. History of reflux 3. Incomplete right bundle branch block on EKG  Recommendations:  The patient has evidence of a mild cardiomyopathy. We went over the etiologies of cardiomyopathy with him. He does not have any symptoms of ischemic heart disease and has very few risk factors. In order to evaluate this I would recommend that he have a myocardial perfusion scan to be sure that he does not have ischemia. I also suggested an iron level to be sure he does not have hemochromatosis. He does not have a history of uncontrolled hypertension and does not have substance abuse or other things that could account for cardiomyopathy. He is really class I at the present time.  I would recommend treatment for LV dysfunction beginning with losartan 50 mg  daily and awaiting the stress test. After that we'll begin beta blockers and add spironolactone. I discussed with the patient and his wife that he could continue to do his activities and that likely we will need to treat him for cardiomyopathy which is most likely idiopathic and then have a repeat echo down the road to determine if it is staying the same or if it resolves. One would wonder whether this severe illness a year or so ago may have been associated with some cardiomyopathy. Thank you for asking me to see him with you. ____________________________ TODAYS ORDERS  1. 12 Lead EKG: Today  2. Treadmill 1 day Cardiolite: First Available  3. Iron/TIBC/UIBC: At Patient Convenience                       ____________________________ Cardiology Physician:  Kerry Hough MD [facility]

## 2015-12-11 ENCOUNTER — Other Ambulatory Visit: Payer: Self-pay | Admitting: Cardiology

## 2015-12-11 DIAGNOSIS — I43 Cardiomyopathy in diseases classified elsewhere: Secondary | ICD-10-CM

## 2015-12-17 ENCOUNTER — Encounter (HOSPITAL_COMMUNITY): Payer: Self-pay

## 2015-12-17 ENCOUNTER — Ambulatory Visit (HOSPITAL_COMMUNITY)
Admission: RE | Admit: 2015-12-17 | Discharge: 2015-12-17 | Disposition: A | Discharge status: Home / Self Care | Payer: BLUE CROSS/BLUE SHIELD | Source: Ambulatory Visit | Attending: Cardiology | Admitting: Cardiology

## 2015-12-17 DIAGNOSIS — I2510 Atherosclerotic heart disease of native coronary artery without angina pectoris: Secondary | ICD-10-CM | POA: Insufficient documentation

## 2015-12-17 DIAGNOSIS — M47894 Other spondylosis, thoracic region: Secondary | ICD-10-CM | POA: Insufficient documentation

## 2015-12-17 DIAGNOSIS — R079 Chest pain, unspecified: Secondary | ICD-10-CM | POA: Insufficient documentation

## 2015-12-17 DIAGNOSIS — I43 Cardiomyopathy in diseases classified elsewhere: Secondary | ICD-10-CM | POA: Diagnosis not present

## 2015-12-17 DIAGNOSIS — R590 Localized enlarged lymph nodes: Secondary | ICD-10-CM | POA: Insufficient documentation

## 2015-12-17 DIAGNOSIS — I429 Cardiomyopathy, unspecified: Secondary | ICD-10-CM | POA: Diagnosis not present

## 2015-12-17 MED ORDER — NITROGLYCERIN 0.4 MG SL SUBL
0.4000 mg | SUBLINGUAL_TABLET | SUBLINGUAL | Status: DC | PRN
  Administered 2015-12-17: 0.4 mg via SUBLINGUAL

## 2015-12-17 MED ORDER — IOHEXOL 350 MG/ML SOLN
80.0000 mL | Freq: Once | INTRAVENOUS | Status: AC | PRN
  Administered 2015-12-17: 80 mL via INTRAVENOUS

## 2015-12-17 MED ORDER — NITROGLYCERIN 0.4 MG SL SUBL
SUBLINGUAL_TABLET | SUBLINGUAL | Status: AC
  Filled 2015-12-17: qty 1

## 2016-08-08 DIAGNOSIS — R9431 Abnormal electrocardiogram [ECG] [EKG]: Secondary | ICD-10-CM | POA: Diagnosis not present

## 2016-08-08 DIAGNOSIS — K219 Gastro-esophageal reflux disease without esophagitis: Secondary | ICD-10-CM | POA: Diagnosis not present

## 2016-08-08 DIAGNOSIS — I428 Other cardiomyopathies: Secondary | ICD-10-CM | POA: Diagnosis not present

## 2016-09-01 DIAGNOSIS — M71572 Other bursitis, not elsewhere classified, left ankle and foot: Secondary | ICD-10-CM | POA: Diagnosis not present

## 2016-09-01 DIAGNOSIS — M722 Plantar fascial fibromatosis: Secondary | ICD-10-CM | POA: Diagnosis not present

## 2016-09-01 DIAGNOSIS — M7732 Calcaneal spur, left foot: Secondary | ICD-10-CM | POA: Diagnosis not present

## 2016-09-08 DIAGNOSIS — M722 Plantar fascial fibromatosis: Secondary | ICD-10-CM | POA: Diagnosis not present

## 2016-09-08 DIAGNOSIS — M71572 Other bursitis, not elsewhere classified, left ankle and foot: Secondary | ICD-10-CM | POA: Diagnosis not present

## 2016-11-02 DIAGNOSIS — Z0000 Encounter for general adult medical examination without abnormal findings: Secondary | ICD-10-CM | POA: Diagnosis not present

## 2016-11-02 DIAGNOSIS — E785 Hyperlipidemia, unspecified: Secondary | ICD-10-CM | POA: Diagnosis not present

## 2016-11-02 DIAGNOSIS — Z125 Encounter for screening for malignant neoplasm of prostate: Secondary | ICD-10-CM | POA: Diagnosis not present

## 2016-11-02 DIAGNOSIS — I429 Cardiomyopathy, unspecified: Secondary | ICD-10-CM | POA: Diagnosis not present

## 2016-11-04 DIAGNOSIS — R3912 Poor urinary stream: Secondary | ICD-10-CM | POA: Diagnosis not present

## 2016-11-04 DIAGNOSIS — R972 Elevated prostate specific antigen [PSA]: Secondary | ICD-10-CM | POA: Diagnosis not present

## 2016-11-04 DIAGNOSIS — N401 Enlarged prostate with lower urinary tract symptoms: Secondary | ICD-10-CM | POA: Diagnosis not present

## 2016-11-04 DIAGNOSIS — R351 Nocturia: Secondary | ICD-10-CM | POA: Diagnosis not present

## 2016-11-16 DIAGNOSIS — N403 Nodular prostate with lower urinary tract symptoms: Secondary | ICD-10-CM | POA: Diagnosis not present

## 2016-11-16 DIAGNOSIS — R972 Elevated prostate specific antigen [PSA]: Secondary | ICD-10-CM | POA: Diagnosis not present

## 2016-11-17 ENCOUNTER — Encounter (HOSPITAL_COMMUNITY): Payer: Self-pay | Admitting: Family Medicine

## 2016-11-17 ENCOUNTER — Emergency Department (HOSPITAL_COMMUNITY)
Admission: EM | Admit: 2016-11-17 | Discharge: 2016-11-17 | Disposition: A | Discharge status: Home / Self Care | Payer: BLUE CROSS/BLUE SHIELD | Attending: Emergency Medicine | Admitting: Emergency Medicine

## 2016-11-17 DIAGNOSIS — R338 Other retention of urine: Secondary | ICD-10-CM

## 2016-11-17 DIAGNOSIS — R339 Retention of urine, unspecified: Secondary | ICD-10-CM | POA: Insufficient documentation

## 2016-11-17 LAB — URINE MICROSCOPIC-ADD ON: Squamous Epithelial / LPF: NONE SEEN

## 2016-11-17 LAB — URINALYSIS, ROUTINE W REFLEX MICROSCOPIC
Bilirubin Urine: NEGATIVE
GLUCOSE, UA: NEGATIVE mg/dL
KETONES UR: NEGATIVE mg/dL
Leukocytes, UA: NEGATIVE
Nitrite: NEGATIVE
PH: 6 (ref 5.0–8.0)
Protein, ur: NEGATIVE mg/dL
Specific Gravity, Urine: 1.016 (ref 1.005–1.030)

## 2016-11-17 MED ORDER — LIDOCAINE HCL 2 % EX GEL: 1.0000 "application " | Freq: Once | CUTANEOUS | Status: DC | PRN

## 2016-11-17 NOTE — ED Provider Notes (Signed)
[facility] DEPT Provider Note   CSN: [id number] Arrival date & time: 11/17/16  0056  By signing my name below, I, Irene Pap, attest that this documentation has been prepared under the direction and in the presence of Veryl Speak, MD. Electronically Signed: Irene Pap, ED Scribe. 11/17/16. 1:56 AM.  History   Chief Complaint Chief Complaint  Patient presents with  . Urinary Retention   The history is provided by the patient. No language interpreter was used.   HPI Comments: Charles [name] is a 55 y.o. male who presents to the Emergency Department complaining of urinary retention onset one day ago. Pt reports associated pelvic pressure. Pt says that his symptoms s/p prostate biopsy yesterday morning. He was not under anesthesia for the procedure. Pt is due to receive the results of the biopsy next week. Pt denies hx of similar symptoms. He denies hematuria, abdominal pain, fever, or chills.   Past Medical History:  Diagnosis Date  . GERD (gastroesophageal reflux disease)   . Medical history non-contributory   . Nonischemic cardiomyopathy ([place]) 12/10/2015    Patient Active Problem List   Diagnosis Date Noted  . Nonischemic cardiomyopathy ([place]) 12/10/2015  . Benign liver cyst 05/06/2014    Past Surgical History:  Procedure Laterality Date  . CHOLECYSTECTOMY N/A 05/22/2014   Procedure: LAPAROSCOPIC CHOLECYSTECTOMY WITH INTRAOPERATIVE CHOLANGIOGRAM;  Surgeon: Adin Hector, MD;  Location: [facility];  Service: General;  Laterality: N/A;  . PROSTATE BIOPSY    . VASECTOMY    . VASECTOMY REVERSAL  2001       Home Medications    Prior to Admission medications   Medication Sig Start Date End Date Taking? Authorizing Provider  HYDROcodone-acetaminophen (NORCO) 5-325 MG per tablet Take 1-2 tablets by mouth every 6 (six) hours as needed for moderate pain. 05/22/14   Fanny Skates, MD  omeprazole (PRILOSEC) 20 MG capsule Take 20 mg by mouth daily.     Historical Provider, MD    Family History History reviewed. No pertinent family history.  Social History Social History  Substance Use Topics  . Smoking status: Never Smoker  . Smokeless tobacco: Never Used  . Alcohol use No     Allergies   Patient has no known allergies.   Review of Systems Review of Systems  Constitutional: Negative for chills and fever.  Gastrointestinal: Negative for abdominal pain.  Genitourinary: Positive for decreased urine volume. Negative for hematuria.  All other systems reviewed and are negative.    Physical Exam Updated Vital Signs BP 124/94 (BP Location: Left Arm)   Pulse 115   Temp 98 F (36.7 C) (Oral)   Resp 18   Ht 5\' 8"  (1.727 m)   Wt 200 lb (90.7 kg)   SpO2 99%   BMI 30.41 kg/m   Physical Exam  Constitutional: He is oriented to person, place, and time. He appears well-developed and well-nourished.  HENT:  Head: Normocephalic.  Eyes: EOM are normal.  Neck: Normal range of motion.  Pulmonary/Chest: Effort normal.  Abdominal: Soft. He exhibits no distension. There is no tenderness.  Musculoskeletal: Normal range of motion.  Neurological: He is alert and oriented to person, place, and time.  Psychiatric: He has a normal mood and affect.  Nursing note and vitals reviewed.    ED Treatments / Results  DIAGNOSTIC STUDIES: Oxygen Saturation is 99% on RA, normal by my interpretation.    COORDINATION OF CARE: 1:55 AM-Discussed treatment plan which includes UA with pt at bedside and pt  agreed to plan.    Labs (all labs ordered are listed, but only abnormal results are displayed) Labs Reviewed  URINALYSIS, ROUTINE W REFLEX MICROSCOPIC (NOT AT [facility])    EKG  EKG Interpretation None       Radiology No results found.  Procedures Procedures (including critical care time)  Medications Ordered in ED Medications  lidocaine (XYLOCAINE) 2 % jelly 1 application (not administered)     Initial Impression / Assessment  and Plan / ED Course  I have reviewed the triage vital signs and the nursing notes.  Pertinent labs & imaging results that were available during my care of the patient were reviewed by me and considered in my medical decision making (see chart for details).  Clinical Course     Patient presents with urinary retention. He underwent a prostate biopsy earlier today and has been unable to void since. His urinalysis is clear. Foley catheter was placed in 1000 mL of urine was obtained. His symptoms have now improved. He will be connected to a leg bag and discharged. He has a follow-up with urology in the next few days.  Final Clinical Impressions(s) / ED Diagnoses   Final diagnoses:  None  I personally performed the services described in this documentation, which was scribed in my presence. The recorded information has been reviewed and is accurate.      New Prescriptions New Prescriptions   No medications on file     Veryl Speak, MD 11/17/16 [phone]

## 2016-11-17 NOTE — Discharge Instructions (Signed)
Follow-up with urology in the next 3 days.  Return to the emergency department if you develop any new or concerning symptoms.

## 2016-11-17 NOTE — ED Notes (Signed)
Bladder scan showed greater than 966 ml

## 2016-11-17 NOTE — ED Triage Notes (Signed)
Patient reports he had a prostate biopsy yesterday morning. Since he has had intermittent urine stream and has pressure.

## 2016-11-22 ENCOUNTER — Other Ambulatory Visit (HOSPITAL_COMMUNITY): Payer: Self-pay | Admitting: Urology

## 2016-11-22 DIAGNOSIS — C61 Malignant neoplasm of prostate: Secondary | ICD-10-CM | POA: Diagnosis not present

## 2016-11-22 DIAGNOSIS — R3912 Poor urinary stream: Secondary | ICD-10-CM | POA: Diagnosis not present

## 2016-11-24 ENCOUNTER — Encounter (HOSPITAL_COMMUNITY)
Admission: RE | Admit: 2016-11-24 | Discharge: 2016-11-24 | Disposition: A | Discharge status: Home / Self Care | Payer: BLUE CROSS/BLUE SHIELD | Source: Ambulatory Visit | Attending: Urology | Admitting: Urology

## 2016-11-24 DIAGNOSIS — R972 Elevated prostate specific antigen [PSA]: Secondary | ICD-10-CM | POA: Diagnosis not present

## 2016-11-24 DIAGNOSIS — C61 Malignant neoplasm of prostate: Secondary | ICD-10-CM

## 2016-11-24 MED ORDER — TECHNETIUM TC 99M MEDRONATE IV KIT
21.3000 | PACK | Freq: Once | INTRAVENOUS | Status: AC | PRN
  Administered 2016-11-24: 21.3 via INTRAVENOUS

## 2016-11-25 DIAGNOSIS — N401 Enlarged prostate with lower urinary tract symptoms: Secondary | ICD-10-CM | POA: Diagnosis not present

## 2016-11-25 DIAGNOSIS — C61 Malignant neoplasm of prostate: Secondary | ICD-10-CM | POA: Diagnosis not present

## 2016-11-25 DIAGNOSIS — R350 Frequency of micturition: Secondary | ICD-10-CM | POA: Diagnosis not present

## 2016-12-01 DIAGNOSIS — C61 Malignant neoplasm of prostate: Secondary | ICD-10-CM | POA: Diagnosis not present

## 2016-12-06 ENCOUNTER — Other Ambulatory Visit: Payer: Self-pay | Admitting: Urology

## 2016-12-09 ENCOUNTER — Encounter: Payer: Self-pay | Admitting: Radiation Oncology

## 2016-12-18 ENCOUNTER — Encounter (HOSPITAL_COMMUNITY): Payer: Self-pay | Admitting: Oncology

## 2016-12-18 ENCOUNTER — Emergency Department (HOSPITAL_COMMUNITY)
Admission: EM | Admit: 2016-12-18 | Discharge: 2016-12-18 | Disposition: A | Discharge status: Home / Self Care | Payer: BLUE CROSS/BLUE SHIELD | Attending: Emergency Medicine | Admitting: Emergency Medicine

## 2016-12-18 DIAGNOSIS — Z8546 Personal history of malignant neoplasm of prostate: Secondary | ICD-10-CM | POA: Insufficient documentation

## 2016-12-18 DIAGNOSIS — R339 Retention of urine, unspecified: Secondary | ICD-10-CM | POA: Insufficient documentation

## 2016-12-18 DIAGNOSIS — R338 Other retention of urine: Secondary | ICD-10-CM

## 2016-12-18 DIAGNOSIS — Z79899 Other long term (current) drug therapy: Secondary | ICD-10-CM | POA: Diagnosis not present

## 2016-12-18 LAB — URINALYSIS, ROUTINE W REFLEX MICROSCOPIC
Bacteria, UA: NONE SEEN
Bilirubin Urine: NEGATIVE
GLUCOSE, UA: NEGATIVE mg/dL
KETONES UR: NEGATIVE mg/dL
Leukocytes, UA: NEGATIVE
NITRITE: NEGATIVE
PH: 7 (ref 5.0–8.0)
PROTEIN: NEGATIVE mg/dL
Specific Gravity, Urine: 1.012 (ref 1.005–1.030)

## 2016-12-18 MED ORDER — LIDOCAINE HCL 2 % EX GEL
1.0000 "application " | Freq: Once | CUTANEOUS | Status: AC | PRN
  Administered 2016-12-18: 1 via URETHRAL
  Filled 2016-12-18: qty 11

## 2016-12-18 NOTE — ED Triage Notes (Signed)
Pt has hx of prostate CA.  Has been unable to urinate since yesterday afternoon.

## 2016-12-18 NOTE — ED Notes (Signed)
Bed: [id number] Expected date:  Expected time:  Means of arrival:  Comments: Hold for cancer patient coming in

## 2016-12-18 NOTE — ED Provider Notes (Signed)
[facility] DEPT Provider Note    By signing my name below, I, Bea Graff, attest that this documentation has been prepared under the direction and in the presence of [employer], PA-C. Electronically Signed: Bea Graff, ED Scribe. 12/18/16. 4:41 AM.    History   Chief Complaint Chief Complaint  Patient presents with  . Urinary Retention    The history is provided by the patient and medical records. No language interpreter was used.     HPI Comments:  Charles [name] is a 55 y.o. male with PMHx of prostate cancer who presents to the Emergency Department complaining of urinary retention that began yesterday. He reports intermittently passing small amounts of urine since onset of symptoms. This progressed to inability to urinate this evening. He reports associated suprapubic pressure. He has not taken anything for pain. He denies modifying factors. He denies hematuria, fever, chills, nausea, vomiting. Urologist is Dr. Alinda Money.   Past Medical History:  Diagnosis Date  . Cancer [facility])    Prostate  . GERD (gastroesophageal reflux disease)   . Medical history non-contributory   . Nonischemic cardiomyopathy ([place]) 12/10/2015    Patient Active Problem List   Diagnosis Date Noted  . Nonischemic cardiomyopathy ([place]) 12/10/2015  . Benign liver cyst 05/06/2014    Past Surgical History:  Procedure Laterality Date  . CHOLECYSTECTOMY N/A 05/22/2014   Procedure: LAPAROSCOPIC CHOLECYSTECTOMY WITH INTRAOPERATIVE CHOLANGIOGRAM;  Surgeon: Adin Hector, MD;  Location: [place];  Service: General;  Laterality: N/A;  . PROSTATE BIOPSY    . VASECTOMY    . VASECTOMY REVERSAL  2001     Home Medications    Prior to Admission medications   Medication Sig Start Date End Date Taking? Authorizing Provider  atorvastatin (LIPITOR) 10 MG tablet Take 10 mg by mouth at bedtime.  12/05/16  Yes Historical Provider, MD  losartan (COZAAR) 50 MG tablet Take 50 mg by  mouth at bedtime.  12/05/16  Yes Historical Provider, MD  metoprolol succinate (TOPROL-XL) 25 MG 24 hr tablet Take 25 mg by mouth at bedtime.  12/05/16  Yes Historical Provider, MD  omeprazole (PRILOSEC) 40 MG capsule Take 40 mg by mouth at bedtime.  12/05/16  Yes Historical Provider, MD  spironolactone (ALDACTONE) 25 MG tablet Take 12.5 mg by mouth at bedtime.   Yes Historical Provider, MD  tamsulosin (FLOMAX) 0.4 MG CAPS capsule Take 0.4 mg by mouth at bedtime.  12/05/16  Yes Historical Provider, MD    Family History No family history on file.  Social History Social History  Substance Use Topics  . Smoking status: Never Smoker  . Smokeless tobacco: Never Used  . Alcohol use No     Allergies   Patient has no known allergies.   Review of Systems Review of Systems A complete 10 system review of systems was obtained and all systems are negative except as noted in the HPI and PMH.    Physical Exam Updated Vital Signs BP 126/78 (BP Location: Left Arm)   Pulse 75   Temp 97.8 F (36.6 C) (Oral)   Resp 16   Ht 5\' 8"  (1.727 m)   Wt 206 lb (93.4 kg)   SpO2 95%   BMI 31.32 kg/m   Physical Exam  Constitutional: He is oriented to person, place, and time. He appears well-developed and well-nourished. No distress.  Nontoxic and in NAD  HENT:  Head: Normocephalic and atraumatic.  Eyes: Conjunctivae and EOM are normal. No scleral icterus.  Neck: Normal  range of motion.  Cardiovascular: Normal rate, regular rhythm and intact distal pulses.   Pulmonary/Chest: Effort normal. No respiratory distress. He has no wheezes. He has no rales.  Respirations even and unlabored  Abdominal: Soft. He exhibits no distension and no mass. There is no tenderness. There is no guarding.  Soft, nontender abdomen  Musculoskeletal: Normal range of motion.  Neurological: He is alert and oriented to person, place, and time. He exhibits normal muscle tone. Coordination normal.  Skin: Skin is warm and dry.  No rash noted. He is not diaphoretic. No erythema. No pallor.  Psychiatric: He has a normal mood and affect. His behavior is normal.  Nursing note and vitals reviewed.    ED Treatments / Results   DIAGNOSTIC STUDIES: Oxygen Saturation is 95% on RA, adequate by my interpretation.   COORDINATION OF CARE: 4:38 AM- Will place foley catheter, order urinalysis and have pt follow up with urologist. Pt verbalizes understanding and agrees to plan.   Medications  lidocaine (XYLOCAINE) 2 % jelly 1 application (1 application Urethral Given 12/18/16 0450)   Labs (all labs ordered are listed, but only abnormal results are displayed) Labs Reviewed  URINALYSIS, ROUTINE W REFLEX MICROSCOPIC - Abnormal; Notable for the following:       Result Value   Color, Urine STRAW (*)    Hgb urine dipstick LARGE (*)    Squamous Epithelial / LPF 0-5 (*)    All other components within normal limits    EKG  EKG Interpretation None       Radiology No results found.  Procedures Procedures (including critical care time)  Medications Ordered in ED Medications  lidocaine (XYLOCAINE) 2 % jelly 1 application (1 application Urethral Given 12/18/16 0450)     Initial Impression / Assessment and Plan / ED Course  I have reviewed the triage vital signs and the nursing notes.  Pertinent labs & imaging results that were available during my care of the patient were reviewed by me and considered in my medical decision making (see chart for details).  Clinical Course     55 year old male with a history of prostate cancer presents to the emergency department for acute urinary retention. Patient initially with 800 mL via in and out catheter. Patient subsequently with a proximally 450 mL in his Foley catheter bag. No blood or clots in catheter bag, though patient did pass some small clots initially per RN. Acute urinary retention likely associated with the patient's known prostate cancer. His discomfort resolved  following Foley catheter placement. Plan for outpatient urology follow-up. Patient is already on Flomax. Return precautions discussed and provided. Patient discharged in stable condition with no unaddressed concerns.   Final Clinical Impressions(s) / ED Diagnoses   Final diagnoses:  Acute urinary retention    New Prescriptions New Prescriptions   No medications on file   I personally performed the services described in this documentation, which was scribed in my presence. The recorded information has been reviewed and is accurate.      Antonietta Breach, PA-C 12/18/16 [id number]    Everlene Balls, MD 12/18/16 [id number]

## 2016-12-18 NOTE — ED Notes (Signed)
800 ml's urine via I&O cath.   Post cath residual of 443 ml's remaining.

## 2016-12-18 NOTE — ED Notes (Signed)
Leg bag place on pt.

## 2016-12-19 ENCOUNTER — Encounter (HOSPITAL_COMMUNITY): Payer: Self-pay

## 2016-12-19 ENCOUNTER — Emergency Department (HOSPITAL_COMMUNITY)
Admission: EM | Admit: 2016-12-19 | Discharge: 2016-12-19 | Disposition: A | Discharge status: Home / Self Care | Payer: BLUE CROSS/BLUE SHIELD | Attending: Emergency Medicine | Admitting: Emergency Medicine

## 2016-12-19 DIAGNOSIS — Z79899 Other long term (current) drug therapy: Secondary | ICD-10-CM | POA: Insufficient documentation

## 2016-12-19 DIAGNOSIS — Y732 Prosthetic and other implants, materials and accessory gastroenterology and urology devices associated with adverse incidents: Secondary | ICD-10-CM | POA: Insufficient documentation

## 2016-12-19 DIAGNOSIS — T83098A Other mechanical complication of other indwelling urethral catheter, initial encounter: Secondary | ICD-10-CM | POA: Insufficient documentation

## 2016-12-19 DIAGNOSIS — Z8546 Personal history of malignant neoplasm of prostate: Secondary | ICD-10-CM | POA: Insufficient documentation

## 2016-12-19 DIAGNOSIS — T839XXA Unspecified complication of genitourinary prosthetic device, implant and graft, initial encounter: Secondary | ICD-10-CM

## 2016-12-19 MED ORDER — LIDOCAINE HCL 2 % EX GEL
1.0000 "application " | Freq: Once | CUTANEOUS | Status: AC
  Administered 2016-12-19: 1 via URETHRAL
  Filled 2016-12-19: qty 11

## 2016-12-19 NOTE — ED Triage Notes (Signed)
Pt presents with c/o catheter problems. Pt reports he had a catheter placed yesterday and since this morning, he has noticed leaking around the top of the catheter. Pt reports that urine is still draining into the bag but that he does sometimes feel some resistance like the urine is not properly draining.

## 2016-12-19 NOTE — ED Provider Notes (Signed)
[facility] DEPT Provider Note   CSN: [id number] Arrival date & time: 12/19/16 0807     History    Chief Complaint  Patient presents with  . Catheter problems     HPI Charles [name] is a 56 y.o. male.  56yo M w/ PMH including prostate CA, Nonischemic cardiomyopathy who presents with leaking around Foley catheter. The patient presented here yesterday with acute urinary retention and had a Foley catheter placed with relief of obstruction. Ever since he's gotten home, he has had problems with urinary leakage around the Foley catheter at urethral meatus. He notes that urine is still draining into the back but sometimes he feels mild resistance and has to strain to get urine stream started. No severe abdominal pain or distention, and no fevers or vomiting. No hematuria. He denies any trauma to the catheter line.  Past Medical History:  Diagnosis Date  . Cancer [facility])    Prostate  . GERD (gastroesophageal reflux disease)   . Medical history non-contributory   . Nonischemic cardiomyopathy ([place]) 12/10/2015     Patient Active Problem List   Diagnosis Date Noted  . Nonischemic cardiomyopathy ([place]) 12/10/2015  . Benign liver cyst 05/06/2014    Past Surgical History:  Procedure Laterality Date  . CHOLECYSTECTOMY N/A 05/22/2014   Procedure: LAPAROSCOPIC CHOLECYSTECTOMY WITH INTRAOPERATIVE CHOLANGIOGRAM;  Surgeon: Adin Hector, MD;  Location: [place];  Service: General;  Laterality: N/A;  . PROSTATE BIOPSY    . VASECTOMY    . VASECTOMY REVERSAL  2001        Home Medications    Prior to Admission medications   Medication Sig Start Date End Date Taking? Authorizing Provider  atorvastatin (LIPITOR) 10 MG tablet Take 10 mg by mouth at bedtime.  12/05/16  Yes Historical Provider, MD  losartan (COZAAR) 50 MG tablet Take 50 mg by mouth at bedtime.  12/05/16  Yes Historical Provider, MD  metoprolol succinate (TOPROL-XL) 25 MG 24 hr tablet Take 25 mg by mouth  at bedtime.  12/05/16  Yes Historical Provider, MD  omeprazole (PRILOSEC) 40 MG capsule Take 40 mg by mouth at bedtime.  12/05/16  Yes Historical Provider, MD  spironolactone (ALDACTONE) 25 MG tablet Take 12.5 mg by mouth at bedtime.   Yes Historical Provider, MD  tamsulosin (FLOMAX) 0.4 MG CAPS capsule Take 0.4 mg by mouth at bedtime.  12/05/16  Yes Historical Provider, MD      No family history on file.   Social History  Substance Use Topics  . Smoking status: Never Smoker  . Smokeless tobacco: Never Used  . Alcohol use No     Allergies     Patient has no known allergies.    Review of Systems  10 Systems reviewed and are negative for acute change except as noted in the HPI.   Physical Exam Updated Vital Signs BP 128/81 (BP Location: Left Arm)   Pulse 92   Temp 97.9 F (36.6 C) (Oral)   Resp 16   Ht 5\' 8"  (1.727 m)   Wt 205 lb (93 kg)   SpO2 93%   BMI 31.17 kg/m   Physical Exam  Constitutional: He is oriented to person, place, and time. He appears well-developed and well-nourished. No distress.  HENT:  Head: Normocephalic and atraumatic.  Eyes: Conjunctivae are normal.  Neck: Neck supple.  Cardiovascular: Normal rate, regular rhythm and normal heart sounds.   Pulmonary/Chest: Effort normal and breath sounds normal.  Abdominal: Soft. Bowel sounds are normal.  He exhibits no distension. There is no tenderness.  Genitourinary: Penis normal.  Genitourinary Comments: Small amount clear urine leaking from urethral meatus, no skin breakdown; clear yellow urine in foley bag with no clots  Neurological: He is alert and oriented to person, place, and time.  Skin: Skin is warm and dry.  Psychiatric: He has a normal mood and affect. Judgment normal.  Nursing note and vitals reviewed.   Chaperone was present during exam.   ED Treatments / Results  Labs (all labs ordered are listed, but only abnormal results are displayed) Labs Reviewed - No data to  display   EKG  EKG Interpretation  Date/Time:    Ventricular Rate:    PR Interval:    QRS Duration:   QT Interval:    QTC Calculation:   R Axis:     Text Interpretation:           Radiology No results found.  Procedures Procedures (including critical care time) Procedures  Medications Ordered in ED  Medications  lidocaine (XYLOCAINE) 2 % jelly 1 application (1 application Urethral Given 12/19/16 1004)     Initial Impression / Assessment and Plan / ED Course  I have reviewed the triage vital signs and the nursing notes.   Clinical Course     PT w/ urinary leakage around foley catheter since placement yesterday, no severe discomfort and no bladder distension on exam. Tried inflating bulb, then nurse Tim exchanged catheter for new 13F. Still w/ mild leakage, therefore replaced with 43F with successful resolution of leakage and good urinary flow. No complaints on re-examination.  Pt to follow up with Dr. Alinda Money, urology, tomorrow. Discussed supportive care as well as return precautions including urinary obstruction, gross hematuria, fever, or abdominal pain. Patient voiced understanding and was discharged in satisfactory condition.  Final Clinical Impressions(s) / ED Diagnoses   Final diagnoses:  None     New Prescriptions   No medications on file       Sharlett Iles, MD 12/19/16 1019

## 2016-12-26 ENCOUNTER — Inpatient Hospital Stay (HOSPITAL_COMMUNITY): Admission: RE | Admit: 2016-12-26 | Payer: Self-pay | Source: Ambulatory Visit

## 2016-12-27 DIAGNOSIS — R3912 Poor urinary stream: Secondary | ICD-10-CM | POA: Diagnosis not present

## 2016-12-27 DIAGNOSIS — N393 Stress incontinence (female) (male): Secondary | ICD-10-CM | POA: Diagnosis not present

## 2016-12-27 NOTE — Patient Instructions (Addendum)
Charles [name]  12/27/2016   Your procedure is scheduled on:  01/05/17  Report to [facility] Main  Entrance take [place]  elevators to 3rd floor to  [place] at   0930 AM.  Call this number if you have problems the morning of surgery [phone]   Remember: ONLY 1 PERSON MAY GO WITH YOU TO SHORT STAY TO GET  READY MORNING OF [place].  Do not eat food or drink liquids :After Midnight.  Clear liquid diet day before surgery             Magnesium citrate one bottle by noon day before surgery             Fleets enema night before surgery   Take these medicines the morning of surgery with A SIP OF WATER: NONE   CLEAR LIQUID DIET   Foods Allowed                                                                     Foods Excluded  Coffee and tea, regular and decaf                             liquids that you cannot  Plain Jell-O in any flavor                                             see through such as: Fruit ices (not with fruit pulp)                                     milk, soups, orange juice  Iced Popsicles                                    All solid food Carbonated beverages, regular and diet                                    Cranberry, grape and apple juices Sports drinks like Gatorade Lightly seasoned clear broth or consume(fat free) Sugar, honey syrup  Sample Menu Breakfast                                Lunch                                     Supper Cranberry juice                    Beef broth                            Chicken  broth Jell-O                                     Grape juice                           Apple juice Coffee or tea                        Jell-O                                      Popsicle                                                Coffee or tea                        Coffee or tea  _____________________________________________________________________               Dennis Bast may not have any metal on your  body including hair pins and              piercings  Do not wear jewelry,lotions, powders or perfumes, deodorant                         Men may shave face and neck.   Do not bring valuables to the hospital. [place].  Contacts, dentures or bridgework may not be worn into surgery.  Leave suitcase in the car. After surgery it may be brought to your room.                 Please read over the following fact sheets you were given: _____________________________________________________________________             [facility] - Preparing for Surgery Before surgery, you can play an important role.  Because skin is not sterile, your skin needs to be as free of germs as possible.  You can reduce the number of germs on your skin by washing with CHG (chlorahexidine gluconate) soap before surgery.  CHG is an antiseptic cleaner which kills germs and bonds with the skin to continue killing germs even after washing. Please DO NOT use if you have an allergy to CHG or antibacterial soaps.  If your skin becomes reddened/irritated stop using the CHG and inform your nurse when you arrive at Short Stay. Do not shave (including legs and underarms) for at least 48 hours prior to the first CHG shower.  You may shave your face/neck. Please follow these instructions carefully:  1.  Shower with CHG Soap the night before surgery and the  morning of Surgery.  2.  If you choose to wash your hair, wash your hair first as usual with your  normal  shampoo.  3.  After you shampoo, rinse your hair and body thoroughly to remove the  shampoo.  4.  Use CHG as you would any other liquid soap.  You can apply chg directly  to the skin and wash                       Gently with a scrungie or clean washcloth.  5.  Apply the CHG Soap to your body ONLY FROM THE NECK DOWN.   Do not use on face/ open                           Wound or open sores. Avoid contact  with eyes, ears mouth and genitals (private parts).                       Wash face,  Genitals (private parts) with your normal soap.             6.  Wash thoroughly, paying special attention to the area where your surgery  will be performed.  7.  Thoroughly rinse your body with warm water from the neck down.  8.  DO NOT shower/wash with your normal soap after using and rinsing off  the CHG Soap.                9.  Pat yourself dry with a clean towel.            10.  Wear clean pajamas.            11.  Place clean sheets on your bed the night of your first shower and do not  sleep with pets. Day of Surgery : Do not apply any lotions/deodorants the morning of surgery.  Please wear clean clothes to the hospital/surgery center.  FAILURE TO FOLLOW THESE INSTRUCTIONS MAY RESULT IN THE CANCELLATION OF YOUR SURGERY PATIENT SIGNATURE_________________________________  NURSE SIGNATURE__________________________________  ________________________________________________________________________   Adam Phenix  An incentive spirometer is a tool that can help keep your lungs clear and active. This tool measures how well you are filling your lungs with each breath. Taking long deep breaths may help reverse or decrease the chance of developing breathing (pulmonary) problems (especially infection) following:  A long period of time when you are unable to move or be active. BEFORE THE PROCEDURE   If the spirometer includes an indicator to show your best effort, your nurse or respiratory therapist will set it to a desired goal.  If possible, sit up straight or lean slightly forward. Try not to slouch.  Hold the incentive spirometer in an upright position. INSTRUCTIONS FOR USE  1. Sit on the edge of your bed if possible, or sit up as far as you can in bed or on a chair. 2. Hold the incentive spirometer in an upright position. 3. Breathe out normally. 4. Place the mouthpiece in your mouth and seal  your lips tightly around it. 5. Breathe in slowly and as deeply as possible, raising the piston or the ball toward the top of the column. 6. Hold your breath for 3-5 seconds or for as long as possible. Allow the piston or ball to fall to the bottom of the column. 7. Remove the mouthpiece from your mouth and breathe out normally. 8. Rest for a few seconds and repeat Steps 1 through 7 at least 10 times every 1-2 hours when you are awake. Take your time and take a few normal breaths between deep breaths. 9. The spirometer may include an indicator to  show your best effort. Use the indicator as a goal to work toward during each repetition. 10. After each set of 10 deep breaths, practice coughing to be sure your lungs are clear. If you have an incision (the cut made at the time of surgery), support your incision when coughing by placing a pillow or rolled up towels firmly against it. Once you are able to get out of bed, walk around indoors and cough well. You may stop using the incentive spirometer when instructed by your caregiver.  RISKS AND COMPLICATIONS  Take your time so you do not get dizzy or light-headed.  If you are in pain, you may need to take or ask for pain medication before doing incentive spirometry. It is harder to take a deep breath if you are having pain. AFTER USE  Rest and breathe slowly and easily.  It can be helpful to keep track of a log of your progress. Your caregiver can provide you with a simple table to help with this. If you are using the spirometer at home, follow these instructions: [place] IF:   You are having difficultly using the spirometer.  You have trouble using the spirometer as often as instructed.  Your pain medication is not giving enough relief while using the spirometer.  You develop fever of 100.5 F (38.1 C) or higher. SEEK IMMEDIATE MEDICAL CARE IF:   You cough up bloody sputum that had not been present before.  You develop fever of  102 F (38.9 C) or greater.  You develop worsening pain at or near the incision site. MAKE SURE YOU:   Understand these instructions.  Will watch your condition.  Will get help right away if you are not doing well or get worse. Document Released: 04/17/2007 Document Revised: 02/27/2012 Document Reviewed: 06/18/2007 ExitCare Patient Information 2014 Exit[employer], [place].   ________________________________________________________________________  WHAT IS A BLOOD TRANSFUSION? Blood Transfusion Information  A transfusion is the replacement of blood or some of its parts. Blood is made up of multiple cells which provide different functions.  Red blood cells carry oxygen and are used for blood loss replacement.  White blood cells fight against infection.  Platelets control bleeding.  Plasma helps clot blood.  Other blood products are available for specialized needs, such as hemophilia or other clotting disorders. BEFORE THE TRANSFUSION  Who gives blood for transfusions?   Healthy volunteers who are fully evaluated to make sure their blood is safe. This is blood bank blood. Transfusion therapy is the safest it has ever been in the practice of medicine. Before blood is taken from a donor, a complete history is taken to make sure that person has no history of diseases nor engages in risky social behavior (examples are intravenous drug use or sexual activity with multiple partners). The donor's travel history is screened to minimize risk of transmitting infections, such as malaria. The donated blood is tested for signs of infectious diseases, such as HIV and hepatitis. The blood is then tested to be sure it is compatible with you in order to minimize the chance of a transfusion reaction. If you or a relative donates blood, this is often done in anticipation of surgery and is not appropriate for emergency situations. It takes many days to process the donated blood. RISKS AND COMPLICATIONS Although  transfusion therapy is very safe and saves many lives, the main dangers of transfusion include:   Getting an infectious disease.  Developing a transfusion reaction. This is an allergic reaction  to something in the blood you were given. Every precaution is taken to prevent this. The decision to have a blood transfusion has been considered carefully by your caregiver before blood is given. Blood is not given unless the benefits outweigh the risks. AFTER THE TRANSFUSION  Right after receiving a blood transfusion, you will usually feel much better and more energetic. This is especially true if your red blood cells have gotten low (anemic). The transfusion raises the level of the red blood cells which carry oxygen, and this usually causes an energy increase.  The nurse administering the transfusion will monitor you carefully for complications. HOME CARE INSTRUCTIONS  No special instructions are needed after a transfusion. You may find your energy is better. Speak with your caregiver about any limitations on activity for underlying diseases you may have. SEEK MEDICAL CARE IF:   Your condition is not improving after your transfusion.  You develop redness or irritation at the intravenous (IV) site. SEEK IMMEDIATE MEDICAL CARE IF:  Any of the following symptoms occur over the next 12 hours:  Shaking chills.  You have a temperature by mouth above 102 F (38.9 C), not controlled by medicine.  Chest, back, or muscle pain.  People around you feel you are not acting correctly or are confused.  Shortness of breath or difficulty breathing.  Dizziness and fainting.  You get a rash or develop hives.  You have a decrease in urine output.  Your urine turns a dark color or changes to pink, red, or brown. Any of the following symptoms occur over the next 10 days:  You have a temperature by mouth above 102 F (38.9 C), not controlled by medicine.  Shortness of breath.  Weakness after normal  activity.  The white part of the eye turns yellow (jaundice).  You have a decrease in the amount of urine or are urinating less often.  Your urine turns a dark color or changes to pink, red, or brown. Document Released: 12/02/2000 Document Revised: 02/27/2012 Document Reviewed: 07/21/2008 [facility] Patient Information 2014 [place], [place].  _______________________________________________________________________

## 2016-12-28 ENCOUNTER — Ambulatory Visit (HOSPITAL_COMMUNITY)
Admission: RE | Admit: 2016-12-28 | Discharge: 2016-12-28 | Disposition: A | Discharge status: Home / Self Care | Payer: BLUE CROSS/BLUE SHIELD | Source: Ambulatory Visit | Attending: Anesthesiology | Admitting: Anesthesiology

## 2016-12-28 ENCOUNTER — Encounter (HOSPITAL_COMMUNITY)
Admission: RE | Admit: 2016-12-28 | Discharge: 2016-12-28 | Disposition: A | Discharge status: Home / Self Care | Payer: BLUE CROSS/BLUE SHIELD | Source: Ambulatory Visit | Attending: Urology | Admitting: Urology

## 2016-12-28 ENCOUNTER — Encounter (HOSPITAL_COMMUNITY): Payer: Self-pay

## 2016-12-28 ENCOUNTER — Encounter (INDEPENDENT_AMBULATORY_CARE_PROVIDER_SITE_OTHER): Payer: Self-pay

## 2016-12-28 DIAGNOSIS — Z01818 Encounter for other preprocedural examination: Secondary | ICD-10-CM

## 2016-12-28 DIAGNOSIS — M6281 Muscle weakness (generalized): Secondary | ICD-10-CM | POA: Diagnosis not present

## 2016-12-28 DIAGNOSIS — R3912 Poor urinary stream: Secondary | ICD-10-CM | POA: Diagnosis not present

## 2016-12-28 DIAGNOSIS — C61 Malignant neoplasm of prostate: Secondary | ICD-10-CM | POA: Insufficient documentation

## 2016-12-28 DIAGNOSIS — Z01812 Encounter for preprocedural laboratory examination: Secondary | ICD-10-CM | POA: Diagnosis not present

## 2016-12-28 DIAGNOSIS — Z01810 Encounter for preprocedural cardiovascular examination: Secondary | ICD-10-CM | POA: Diagnosis not present

## 2016-12-28 DIAGNOSIS — R05 Cough: Secondary | ICD-10-CM | POA: Diagnosis not present

## 2016-12-28 DIAGNOSIS — R079 Chest pain, unspecified: Secondary | ICD-10-CM | POA: Diagnosis not present

## 2016-12-28 DIAGNOSIS — B349 Viral infection, unspecified: Secondary | ICD-10-CM | POA: Diagnosis not present

## 2016-12-28 LAB — BASIC METABOLIC PANEL
ANION GAP: 6 (ref 5–15)
BUN: 16 mg/dL (ref 6–20)
CALCIUM: 8.8 mg/dL — AB (ref 8.9–10.3)
CO2: 28 mmol/L (ref 22–32)
CREATININE: 0.93 mg/dL (ref 0.61–1.24)
Chloride: 103 mmol/L (ref 101–111)
GFR calc non Af Amer: >60 mL/min (ref >60)
Glucose, Bld: 87 mg/dL (ref 65–99)
Potassium: 4.2 mmol/L (ref 3.5–5.1)
SODIUM: 137 mmol/L (ref 135–145)

## 2016-12-28 LAB — CBC
HCT: 39.2 % (ref 39.0–52.0)
Hemoglobin: 13.1 g/dL (ref 13.0–17.0)
MCH: 29.2 pg (ref 26.0–34.0)
MCHC: 33.4 g/dL (ref 30.0–36.0)
MCV: 87.5 fL (ref 78.0–100.0)
Platelets: 240 10*3/uL (ref 150–400)
RBC: 4.48 MIL/uL (ref 4.22–5.81)
RDW: 13 % (ref 11.5–15.5)
WBC: 6.1 10*3/uL (ref 4.0–10.5)

## 2016-12-28 LAB — ABO/RH: ABO/RH(D): O POS

## 2016-12-30 ENCOUNTER — Ambulatory Visit: Payer: BLUE CROSS/BLUE SHIELD | Admitting: Radiation Oncology

## 2016-12-31 DIAGNOSIS — R05 Cough: Secondary | ICD-10-CM | POA: Diagnosis not present

## 2017-01-02 DIAGNOSIS — C61 Malignant neoplasm of prostate: Secondary | ICD-10-CM | POA: Diagnosis not present

## 2017-01-02 DIAGNOSIS — M62838 Other muscle spasm: Secondary | ICD-10-CM | POA: Diagnosis not present

## 2017-01-02 DIAGNOSIS — N393 Stress incontinence (female) (male): Secondary | ICD-10-CM | POA: Diagnosis not present

## 2017-01-02 DIAGNOSIS — M6281 Muscle weakness (generalized): Secondary | ICD-10-CM | POA: Diagnosis not present

## 2017-01-03 NOTE — H&P (Signed)
CC/HPI: CC: Prostate Cancer     Mr. Michalko is a 56 year old gentleman who has had a rising PSA over the past two years increasing from 2.0 in 2015 to 4.1 in 2016 to 12.7 in 2017. He was sent to Dr. Risa Grill for evaluation and was noted to have a markedly abnormal prostate exam with concern for right sided locally advanced prostate malignancy. 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   Family history: None.   Imaging studies: CT abdomen and pelvis (11/24/16) - negative for metastatic disease. Bone scan (11/24/16) - negative for metastatic disease.   PMH: He has a history of GERD. He also has a history of nonischemic cardiomyopathy with a baseline ejection fraction of 42%. He is followed by Dr. Tollie Eth.  PSH: Laparoscopic cholecystectomy.   TNM stage: cT3a N0 M0 (right sided EPE extensively)  PSA: 12.7  Gleason score: 5+5=10  Biopsy (11/16/16): 11/12 cores positive  Left: L lateral apex (50%, 5+5=10), L apex (20%, 5+5=10), L mid (30%, 5+4=9), L lateral base (< 5%, 4+4=8), L base (90%, 5+5=10)  Right: R apex (90%, 5+4=9, PNI), R lateral apex (80%, 5+5=10, PNI), R mid (90%, 5+4=9, PNI), R lateral mid (50%, 5+5=10), R base (90%, 5+4=9, PNI), R lateral base (90%, 5+4=9)  Prostate volume: 57 cc   Nomogram  OC disease: 3%  EPE: 95%  SVI: 65%  LNI: 68%  PFS (5 year, 10 year): 13%,7%   Urinary function: IPSS is 35.  Erectile function: SHIM score is 25.     ALLERGIES: No Known Allergies    MEDICATIONS: Metoprolol Succinate  Omeprazole 40 mg capsule,delayed release  Tamsulosin Hcl 0.4 mg capsule, ext release 24 hr  Losartan Potassium  Spironolactone     GU PSH: Prostate Needle Biopsy - 11/16/2016 Vasectomy - 1995      PSH Notes: Vasectomy Reversal 2000   NON-GU PSH: Cholecystectomy - 2015    GU PMH: BPH w/LUTS - 11/04/2016 Nodular prostate w/ LUTS -  11/04/2016 Weak Urinary Stream - 11/04/2016 Prostate Cancer    NON-GU PMH: Cardiomyopathy, unspecified GERD    FAMILY HISTORY: GSW (gunshot wound) - Father    Notes: No family history of prostate cancer.   SOCIAL HISTORY: Marital Status: Married Current Smoking Status: Patient has never smoked.  Has never drank.  Drinks 4+ caffeinated drinks per day. Patient's occupation Estate manager/land agent.    REVIEW OF SYSTEMS:    GU Review Male:   Patient reports frequent urination, hard to postpone urination, get up at night to urinate, stream starts and stops, trouble starting your streams, and have to strain to urinate . Patient denies burning/ pain with urination and leakage of urine.  Gastrointestinal (Upper):   Patient denies nausea and vomiting.  Gastrointestinal (Lower):   Patient denies diarrhea and constipation.  Constitutional:   Patient reports fatigue. Patient denies fever, night sweats, and weight loss.  Skin:   Patient denies skin rash/ lesion and itching.  Eyes:   Patient denies blurred vision and double vision.  Ears/ Nose/ Throat:   Patient denies sore throat and sinus problems.  Hematologic/Lymphatic:   Patient denies swollen glands and easy bruising.  Cardiovascular:   Patient denies leg swelling and chest pains.  Respiratory:   Patient denies cough and shortness of breath.  Endocrine:   Patient denies excessive thirst.  Musculoskeletal:   Patient denies back  pain and joint pain.  Neurological:   Patient denies headaches and dizziness.  Psychologic:   Patient denies depression and anxiety.   VITAL SIGNS:    Weight 202 lb / 91.63 kg  Height 68 in / 172.72 cm  BMI 30.7 kg/m    MULTI-SYSTEM PHYSICAL EXAMINATION:    Constitutional: Well-nourished. No physical deformities. Normally developed. Good grooming.  Neck: Neck symmetrical, not swollen. Normal tracheal position.  Respiratory: No labored breathing, no use of accessory muscles. Clear bilaterally.   Cardiovascular: Normal temperature, normal extremity pulses, no swelling, no varicosities. RRR.  Lymphatic: No enlargement of neck, axillae, groin.  Skin: No paleness, no jaundice, no cyanosis. No lesion, no ulcer, no rash.  Neurologic / Psychiatric: Oriented to time, oriented to place, oriented to person. No depression, no anxiety, no agitation.  Gastrointestinal: No mass, no tenderness, no rigidity, non obese abdomen.  Eyes: Normal conjunctivae. Normal eyelids.  Ears, Nose, Mouth, and Throat: Left ear no scars, no lesions, no masses. Right ear no scars, no lesions, no masses. Nose no scars, no lesions, no masses. Normal hearing. Normal lips.  Musculoskeletal: Normal gait and station of head and neck.     PAST DATA REVIEWED:  Source Of History:  Patient  Lab Test Review:   PSA  Records Review:   Pathology Reports, Previous Patient Records  Urine Test Review:   Urinalysis  X-Ray Review: C.T. Abdomen/Pelvis: Reviewed Films.  Bone Scan: Reviewed Films.       ASSESSMENT:      ICD-10 Details  1 GU:   Prostate Cancer - C61    PLAN:           Notes:   1. Prostate cancer: I had a detailed discussion with Mr. Conkey and his wife today regarding his very high risk, locally advanced prostate cancer. Although his staging studies are negative, he understands that he is at extremely high risk for harboring micrometastatic disease. However, in the absence of obvious metastatic disease, I have recommended he proceed with therapy of curative intent. We have discussed options of primary surgical therapy which would likely require adjuvant or salvage therapy subsequently. We also discussed the option of primary radiation therapy in combination with long-term androgen deprivation as an alternative. We discussed the pros and cons of each approach.   The patient was counseled about the natural history of prostate cancer and the standard treatment options that are available for prostate cancer. It was  explained to him how his age and life expectancy, clinical stage, Gleason score, and PSA affect his prognosis, the decision to proceed with additional staging studies, as well as how that information influences recommended treatment strategies. We discussed the roles for active surveillance, radiation therapy, surgical therapy, androgen deprivation, as well as ablative therapy options for the treatment of prostate cancer as appropriate to his individual cancer situation. We discussed the risks and benefits of these options with regard to their impact on cancer control and also in terms of potential adverse events, complications, and impact on quality of life particularly related to urinary and sexual function. The patient was encouraged to ask questions throughout the discussion today and all questions were answered to his stated satisfaction. In addition, the patient was provided with and/or directed to appropriate resources and literature for further education about prostate cancer and treatment options.   We discussed surgical therapy for prostate cancer including the different available surgical approaches. We discussed, in detail, the risks and expectations of surgery with regard to cancer control,  urinary control, and erectile function as well as the expected postoperative recovery process. Additional risks of surgery including but not limited to bleeding, infection, hernia formation, nerve damage, lymphocele formation, bowel/rectal injury potentially necessitating colostomy, damage to the urinary tract resulting in urine leakage, urethral stricture, and the cardiopulmonary risks such as myocardial infarction, stroke, death, venothromboembolism, etc. were explained. The risk of open surgical conversion for robotic/laparoscopic prostatectomy was also discussed.   After extensive discussion, he is wife do wish to proceed with primary surgical therapy considering his young age and extensive voiding complaints  and symptoms related to obstruction. He understands that surgery is not likely be curative without additional treatment but will offer [place] pathologic staging, relief of his obstruction, and may offer him benefit to improve response to systemic therapy in the future possibly.   He will be scheduled for a wide, non-nerve sparing robot-assisted laparoscopic radical prostatectomy and extended pelvic lymphadenectomy.   I did offer him the opportunity to be seen in the multidisciplinary clinic prior to surgery to confirm his decision but he and his wife feel very confident in this approach and feel well-informed. They likely will want to follow up in the multidisciplinary clinic later to discuss additional treatment options for adjuvant/salvage therapy.

## 2017-01-05 ENCOUNTER — Encounter (HOSPITAL_COMMUNITY): Payer: Self-pay | Admitting: *Deleted

## 2017-01-05 ENCOUNTER — Inpatient Hospital Stay (HOSPITAL_COMMUNITY): Payer: BLUE CROSS/BLUE SHIELD | Admitting: Anesthesiology

## 2017-01-05 ENCOUNTER — Observation Stay (HOSPITAL_COMMUNITY)
Admission: RE | Admit: 2017-01-05 | Discharge: 2017-01-07 | Disposition: A | Discharge status: Home / Self Care | Payer: BLUE CROSS/BLUE SHIELD | Source: Ambulatory Visit | Attending: Urology | Admitting: Urology

## 2017-01-05 ENCOUNTER — Encounter (HOSPITAL_COMMUNITY)
Admission: RE | Disposition: A | Discharge status: Home / Self Care | Payer: Self-pay | Source: Ambulatory Visit | Attending: Urology

## 2017-01-05 DIAGNOSIS — N401 Enlarged prostate with lower urinary tract symptoms: Secondary | ICD-10-CM | POA: Insufficient documentation

## 2017-01-05 DIAGNOSIS — I428 Other cardiomyopathies: Secondary | ICD-10-CM | POA: Insufficient documentation

## 2017-01-05 DIAGNOSIS — C61 Malignant neoplasm of prostate: Principal | ICD-10-CM | POA: Insufficient documentation

## 2017-01-05 DIAGNOSIS — I509 Heart failure, unspecified: Secondary | ICD-10-CM | POA: Diagnosis not present

## 2017-01-05 DIAGNOSIS — R350 Frequency of micturition: Secondary | ICD-10-CM | POA: Insufficient documentation

## 2017-01-05 DIAGNOSIS — N3289 Other specified disorders of bladder: Secondary | ICD-10-CM | POA: Insufficient documentation

## 2017-01-05 DIAGNOSIS — K219 Gastro-esophageal reflux disease without esophagitis: Secondary | ICD-10-CM | POA: Insufficient documentation

## 2017-01-05 DIAGNOSIS — R3912 Poor urinary stream: Secondary | ICD-10-CM | POA: Diagnosis not present

## 2017-01-05 DIAGNOSIS — I429 Cardiomyopathy, unspecified: Secondary | ICD-10-CM | POA: Diagnosis not present

## 2017-01-05 HISTORY — PX: LYMPHADENECTOMY: SHX5960

## 2017-01-05 HISTORY — PX: ROBOT ASSISTED LAPAROSCOPIC RADICAL PROSTATECTOMY: SHX5141

## 2017-01-05 LAB — BASIC METABOLIC PANEL
ANION GAP: 8 (ref 5–15)
BUN: 17 mg/dL (ref 6–20)
CHLORIDE: 104 mmol/L (ref 101–111)
CO2: 24 mmol/L (ref 22–32)
Calcium: 8.1 mg/dL — ABNORMAL LOW (ref 8.9–10.3)
Creatinine, Ser: 1.13 mg/dL (ref 0.61–1.24)
GFR calc Af Amer: >60 mL/min (ref >60)
Glucose, Bld: 163 mg/dL — ABNORMAL HIGH (ref 65–99)
POTASSIUM: 3.8 mmol/L (ref 3.5–5.1)
SODIUM: 136 mmol/L (ref 135–145)

## 2017-01-05 LAB — TYPE AND SCREEN
ABO/RH(D): O POS
Antibody Screen: NEGATIVE

## 2017-01-05 LAB — HEMOGLOBIN AND HEMATOCRIT, BLOOD
HEMATOCRIT: 36 % — AB (ref 39.0–52.0)
Hemoglobin: 12.4 g/dL — ABNORMAL LOW (ref 13.0–17.0)

## 2017-01-05 SURGERY — XI ROBOTIC ASSISTED LAPAROSCOPIC RADICAL PROSTATECTOMY LEVEL 3
Anesthesia: General

## 2017-01-05 MED ORDER — METOPROLOL SUCCINATE ER 25 MG PO TB24
25.0000 mg | ORAL_TABLET | Freq: Every day | ORAL | Status: DC
Start: 2017-01-05 — End: 2017-01-07
  Administered 2017-01-05 – 2017-01-06 (×2): 25 mg via ORAL
  Filled 2017-01-05 (×2): qty 1

## 2017-01-05 MED ORDER — LIDOCAINE 2% (20 MG/ML) 5 ML SYRINGE
INTRAMUSCULAR | Status: DC | PRN
  Administered 2017-01-05: 100 mg via INTRAVENOUS

## 2017-01-05 MED ORDER — ONDANSETRON HCL 4 MG/2ML IJ SOLN
INTRAMUSCULAR | Status: AC
  Filled 2017-01-05: qty 2

## 2017-01-05 MED ORDER — DOCUSATE SODIUM 100 MG PO CAPS
100.0000 mg | ORAL_CAPSULE | Freq: Two times a day (BID) | ORAL | Status: DC
  Administered 2017-01-05 – 2017-01-07 (×4): 100 mg via ORAL
  Filled 2017-01-05 (×4): qty 1

## 2017-01-05 MED ORDER — ROCURONIUM BROMIDE 50 MG/5ML IV SOSY
PREFILLED_SYRINGE | INTRAVENOUS | Status: AC
  Filled 2017-01-05: qty 5

## 2017-01-05 MED ORDER — EPHEDRINE SULFATE-NACL 50-0.9 MG/10ML-% IV SOSY
PREFILLED_SYRINGE | INTRAVENOUS | Status: DC | PRN
  Administered 2017-01-05 (×2): 10 mg via INTRAVENOUS

## 2017-01-05 MED ORDER — BUPIVACAINE-EPINEPHRINE 0.25% -1:200000 IJ SOLN
INTRAMUSCULAR | Status: DC | PRN
  Administered 2017-01-05: 30 mL

## 2017-01-05 MED ORDER — SUFENTANIL CITRATE 50 MCG/ML IV SOLN
INTRAVENOUS | Status: DC | PRN
  Administered 2017-01-05 (×2): 10 ug via INTRAVENOUS
  Administered 2017-01-05: 30 ug via INTRAVENOUS

## 2017-01-05 MED ORDER — SODIUM CHLORIDE 0.9 % IJ SOLN
INTRAMUSCULAR | Status: AC
  Filled 2017-01-05: qty 10

## 2017-01-05 MED ORDER — PROPOFOL 10 MG/ML IV BOLUS
INTRAVENOUS | Status: DC | PRN
  Administered 2017-01-05: 100 mg via INTRAVENOUS

## 2017-01-05 MED ORDER — HYDROMORPHONE HCL 1 MG/ML IJ SOLN
INTRAMUSCULAR | Status: AC
  Administered 2017-01-05: 0.5 mg via INTRAVENOUS
  Filled 2017-01-05: qty 1

## 2017-01-05 MED ORDER — SULFAMETHOXAZOLE-TRIMETHOPRIM 800-160 MG PO TABS: 1.0000 | ORAL_TABLET | Freq: Two times a day (BID) | ORAL | 0 refills | Status: AC

## 2017-01-05 MED ORDER — KCL IN DEXTROSE-NACL 20-5-0.45 MEQ/L-%-% IV SOLN
INTRAVENOUS | Status: DC
  Administered 2017-01-05 – 2017-01-06 (×2): via INTRAVENOUS
  Filled 2017-01-05 (×2): qty 1000

## 2017-01-05 MED ORDER — OXYCODONE HCL 5 MG/5ML PO SOLN: 5.0000 mg | Freq: Once | ORAL | Status: DC | PRN

## 2017-01-05 MED ORDER — CEFAZOLIN SODIUM-DEXTROSE 2-4 GM/100ML-% IV SOLN
INTRAVENOUS | Status: AC
  Filled 2017-01-05: qty 100

## 2017-01-05 MED ORDER — PROPOFOL 10 MG/ML IV BOLUS
INTRAVENOUS | Status: AC
  Filled 2017-01-05: qty 20

## 2017-01-05 MED ORDER — CEFAZOLIN SODIUM-DEXTROSE 2-4 GM/100ML-% IV SOLN
2.0000 g | INTRAVENOUS | Status: AC
  Administered 2017-01-05: 2 g via INTRAVENOUS
  Filled 2017-01-05: qty 100

## 2017-01-05 MED ORDER — MAGNESIUM CITRATE PO SOLN: 1.0000 | Freq: Once | ORAL | Status: DC

## 2017-01-05 MED ORDER — DEXAMETHASONE SODIUM PHOSPHATE 10 MG/ML IJ SOLN
INTRAMUSCULAR | Status: AC
  Filled 2017-01-05: qty 1

## 2017-01-05 MED ORDER — ROCURONIUM BROMIDE 10 MG/ML (PF) SYRINGE
PREFILLED_SYRINGE | INTRAVENOUS | Status: DC | PRN
  Administered 2017-01-05 (×2): 20 mg via INTRAVENOUS
  Administered 2017-01-05: 50 mg via INTRAVENOUS
  Administered 2017-01-05 (×2): 10 mg via INTRAVENOUS

## 2017-01-05 MED ORDER — MORPHINE SULFATE (PF) 2 MG/ML IV SOLN
2.0000 mg | INTRAVENOUS | Status: DC | PRN
  Administered 2017-01-05 – 2017-01-06 (×3): 2 mg via INTRAVENOUS
  Filled 2017-01-05 (×4): qty 1

## 2017-01-05 MED ORDER — FENTANYL CITRATE (PF) 100 MCG/2ML IJ SOLN
INTRAMUSCULAR | Status: DC | PRN
  Administered 2017-01-05 (×2): 50 ug via INTRAVENOUS

## 2017-01-05 MED ORDER — HEPARIN SODIUM (PORCINE) 1000 UNIT/ML IJ SOLN
INTRAMUSCULAR | Status: AC
  Filled 2017-01-05: qty 1

## 2017-01-05 MED ORDER — SPIRONOLACTONE 25 MG PO TABS
12.5000 mg | ORAL_TABLET | Freq: Every day | ORAL | Status: DC
  Administered 2017-01-06: 12.5 mg via ORAL
  Filled 2017-01-05: qty 1

## 2017-01-05 MED ORDER — KETOROLAC TROMETHAMINE 15 MG/ML IJ SOLN
15.0000 mg | Freq: Three times a day (TID) | INTRAMUSCULAR | Status: DC
  Administered 2017-01-05 – 2017-01-07 (×5): 15 mg via INTRAVENOUS
  Filled 2017-01-05 (×5): qty 1

## 2017-01-05 MED ORDER — PANTOPRAZOLE SODIUM 40 MG PO TBEC
40.0000 mg | DELAYED_RELEASE_TABLET | Freq: Every day | ORAL | Status: DC
  Administered 2017-01-05 – 2017-01-06 (×2): 40 mg via ORAL
  Filled 2017-01-05 (×2): qty 1

## 2017-01-05 MED ORDER — LACTATED RINGERS IV SOLN
INTRAVENOUS | Status: DC
  Administered 2017-01-05 (×2): via INTRAVENOUS

## 2017-01-05 MED ORDER — HYDROMORPHONE HCL 1 MG/ML IJ SOLN
0.2500 mg | INTRAMUSCULAR | Status: DC | PRN
  Administered 2017-01-05 (×4): 0.5 mg via INTRAVENOUS

## 2017-01-05 MED ORDER — ACETAMINOPHEN 500 MG PO TABS
1000.0000 mg | ORAL_TABLET | Freq: Four times a day (QID) | ORAL | Status: AC
  Administered 2017-01-05 – 2017-01-06 (×4): 1000 mg via ORAL
  Filled 2017-01-05 (×4): qty 2

## 2017-01-05 MED ORDER — LIDOCAINE 2% (20 MG/ML) 5 ML SYRINGE
INTRAMUSCULAR | Status: AC
  Filled 2017-01-05: qty 5

## 2017-01-05 MED ORDER — SODIUM CHLORIDE 0.9 % IV BOLUS (SEPSIS)
1000.0000 mL | Freq: Once | INTRAVENOUS | Status: AC
  Administered 2017-01-05: 1000 mL via INTRAVENOUS

## 2017-01-05 MED ORDER — MIDAZOLAM HCL 2 MG/2ML IJ SOLN
INTRAMUSCULAR | Status: DC | PRN
  Administered 2017-01-05: 2 mg via INTRAVENOUS

## 2017-01-05 MED ORDER — MIDAZOLAM HCL 2 MG/2ML IJ SOLN
INTRAMUSCULAR | Status: AC
  Filled 2017-01-05: qty 2

## 2017-01-05 MED ORDER — PROMETHAZINE HCL 25 MG/ML IJ SOLN: 6.2500 mg | INTRAMUSCULAR | Status: DC | PRN

## 2017-01-05 MED ORDER — LOSARTAN POTASSIUM 50 MG PO TABS
50.0000 mg | ORAL_TABLET | Freq: Every day | ORAL | Status: DC
  Administered 2017-01-06: 50 mg via ORAL
  Filled 2017-01-05: qty 1

## 2017-01-05 MED ORDER — FENTANYL CITRATE (PF) 100 MCG/2ML IJ SOLN
INTRAMUSCULAR | Status: AC
  Filled 2017-01-05: qty 2

## 2017-01-05 MED ORDER — SUFENTANIL CITRATE 50 MCG/ML IV SOLN
INTRAVENOUS | Status: AC
  Filled 2017-01-05: qty 1

## 2017-01-05 MED ORDER — SUCCINYLCHOLINE CHLORIDE 200 MG/10ML IV SOSY
PREFILLED_SYRINGE | INTRAVENOUS | Status: DC | PRN
  Administered 2017-01-05: 100 mg via INTRAVENOUS

## 2017-01-05 MED ORDER — HYDROMORPHONE HCL 2 MG/ML IJ SOLN
INTRAMUSCULAR | Status: AC
  Filled 2017-01-05: qty 1

## 2017-01-05 MED ORDER — SODIUM CHLORIDE 0.9 % IR SOLN
Status: DC | PRN
  Administered 2017-01-05: 1000 mL

## 2017-01-05 MED ORDER — OXYCODONE HCL 5 MG PO TABS: 5.0000 mg | ORAL_TABLET | Freq: Once | ORAL | Status: DC | PRN

## 2017-01-05 MED ORDER — SUGAMMADEX SODIUM 200 MG/2ML IV SOLN
INTRAVENOUS | Status: DC | PRN
  Administered 2017-01-05: 200 mg via INTRAVENOUS

## 2017-01-05 MED ORDER — FLEET ENEMA 7-19 GM/118ML RE ENEM: 1.0000 | ENEMA | Freq: Once | RECTAL | Status: DC

## 2017-01-05 MED ORDER — LACTATED RINGERS IV SOLN
INTRAVENOUS | Status: DC | PRN
  Administered 2017-01-05: 13:00:00

## 2017-01-05 MED ORDER — ATORVASTATIN CALCIUM 10 MG PO TABS
10.0000 mg | ORAL_TABLET | Freq: Every day | ORAL | Status: DC
Start: 2017-01-05 — End: 2017-01-07
  Administered 2017-01-05 – 2017-01-06 (×2): 10 mg via ORAL
  Filled 2017-01-05 (×2): qty 1

## 2017-01-05 MED ORDER — EPHEDRINE 5 MG/ML INJ
INTRAVENOUS | Status: AC
  Filled 2017-01-05: qty 10

## 2017-01-05 MED ORDER — DEXAMETHASONE SODIUM PHOSPHATE 10 MG/ML IJ SOLN
INTRAMUSCULAR | Status: DC | PRN
  Administered 2017-01-05: 10 mg via INTRAVENOUS

## 2017-01-05 MED ORDER — OXYCODONE-ACETAMINOPHEN 5-325 MG PO TABS: 1.0000 | ORAL_TABLET | ORAL | 0 refills | Status: DC | PRN

## 2017-01-05 MED ORDER — SUCCINYLCHOLINE CHLORIDE 200 MG/10ML IV SOSY
PREFILLED_SYRINGE | INTRAVENOUS | Status: AC
Start: 2017-01-05 — End: 2017-01-05
  Filled 2017-01-05: qty 10

## 2017-01-05 MED ORDER — SENNA 8.6 MG PO TABS
1.0000 | ORAL_TABLET | Freq: Two times a day (BID) | ORAL | Status: DC
  Administered 2017-01-05 – 2017-01-07 (×4): 8.6 mg via ORAL
  Filled 2017-01-05 (×4): qty 1

## 2017-01-05 MED ORDER — BUPIVACAINE HCL (PF) 0.25 % IJ SOLN
INTRAMUSCULAR | Status: AC
  Filled 2017-01-05: qty 30

## 2017-01-05 MED ORDER — METOCLOPRAMIDE HCL 5 MG/ML IJ SOLN
INTRAMUSCULAR | Status: DC | PRN
  Administered 2017-01-05: 5 mg via INTRAVENOUS

## 2017-01-05 MED ORDER — ONDANSETRON HCL 4 MG/2ML IJ SOLN
INTRAMUSCULAR | Status: DC | PRN
Start: 2017-01-05 — End: 2017-01-05
  Administered 2017-01-05: 4 mg via INTRAVENOUS

## 2017-01-05 MED ORDER — ONDANSETRON HCL 4 MG/2ML IJ SOLN: 4.0000 mg | INTRAMUSCULAR | Status: DC | PRN

## 2017-01-05 MED ORDER — HYDROMORPHONE HCL 1 MG/ML IJ SOLN
INTRAMUSCULAR | Status: DC | PRN
  Administered 2017-01-05: .4 mg via INTRAVENOUS
  Administered 2017-01-05: .8 mg via INTRAVENOUS
  Administered 2017-01-05 (×2): .4 mg via INTRAVENOUS

## 2017-01-05 SURGICAL SUPPLY — 54 items
ADH SKN CLS APL DERMABOND .7 (GAUZE/BANDAGES/DRESSINGS) ×2
APPLICATOR COTTON TIP 6IN STRL (MISCELLANEOUS) ×4 IMPLANT
CATH FOLEY 2WAY SLVR 18FR 30CC (CATHETERS) ×4 IMPLANT
CATH ROBINSON RED A/P 16FR (CATHETERS) ×4 IMPLANT
CATH ROBINSON RED A/P 8FR (CATHETERS) ×4 IMPLANT
CATH TIEMANN FOLEY 18FR 5CC (CATHETERS) ×4 IMPLANT
CHLORAPREP W/TINT 26ML (MISCELLANEOUS) ×4 IMPLANT
CLIP LIGATING HEM O LOK PURPLE (MISCELLANEOUS) ×8 IMPLANT
COVER SURGICAL LIGHT HANDLE (MISCELLANEOUS) ×4 IMPLANT
COVER TIP SHEARS 8 DVNC (MISCELLANEOUS) ×2 IMPLANT
COVER TIP SHEARS 8MM DA VINCI (MISCELLANEOUS) ×2
CUTTER ECHEON FLEX ENDO 45 340 (ENDOMECHANICALS) ×4 IMPLANT
DECANTER SPIKE VIAL GLASS SM (MISCELLANEOUS) ×2 IMPLANT
DERMABOND ADVANCED (GAUZE/BANDAGES/DRESSINGS) ×2
DERMABOND ADVANCED .7 DNX12 (GAUZE/BANDAGES/DRESSINGS) IMPLANT
DRAPE ARM DVNC X/XI (DISPOSABLE) ×8 IMPLANT
DRAPE COLUMN DVNC XI (DISPOSABLE) ×2 IMPLANT
DRAPE DA VINCI XI ARM (DISPOSABLE) ×8
DRAPE DA VINCI XI COLUMN (DISPOSABLE) ×2
DRAPE SURG IRRIG POUCH 19X23 (DRAPES) ×4 IMPLANT
DRSG TEGADERM 4X4.75 (GAUZE/BANDAGES/DRESSINGS) ×4 IMPLANT
ELECT REM PT RETURN 9FT ADLT (ELECTROSURGICAL) ×4
ELECTRODE REM PT RTRN 9FT ADLT (ELECTROSURGICAL) ×2 IMPLANT
GLOVE BIO SURGEON STRL SZ 6.5 (GLOVE) ×3 IMPLANT
GLOVE BIO SURGEONS STRL SZ 6.5 (GLOVE) ×1
GLOVE BIOGEL M STRL SZ7.5 (GLOVE) ×12 IMPLANT
GOWN STRL REUS W/TWL LRG LVL3 (GOWN DISPOSABLE) ×18 IMPLANT
HOLDER FOLEY CATH W/STRAP (MISCELLANEOUS) ×4 IMPLANT
IRRIG SUCT STRYKERFLOW 2 WTIP (MISCELLANEOUS) ×4
IRRIGATION SUCT STRKRFLW 2 WTP (MISCELLANEOUS) ×2 IMPLANT
IV LACTATED RINGERS 1000ML (IV SOLUTION) ×2 IMPLANT
NDL SAFETY ECLIPSE 18X1.5 (NEEDLE) ×2 IMPLANT
NEEDLE HYPO 18GX1.5 SHARP (NEEDLE) ×4
PACK ROBOT UROLOGY CUSTOM (CUSTOM PROCEDURE TRAY) ×4 IMPLANT
RELOAD GREEN ECHELON 45 (STAPLE) ×4 IMPLANT
SEAL CANN UNIV 5-8 DVNC XI (MISCELLANEOUS) ×8 IMPLANT
SEAL XI 5MM-8MM UNIVERSAL (MISCELLANEOUS) ×8
SOLUTION ELECTROLUBE (MISCELLANEOUS) ×4 IMPLANT
SUT ETHILON 3 0 PS 1 (SUTURE) ×4 IMPLANT
SUT MNCRL 3 0 RB1 (SUTURE) ×2 IMPLANT
SUT MNCRL 3 0 VIOLET RB1 (SUTURE) ×2 IMPLANT
SUT MNCRL AB 4-0 PS2 18 (SUTURE) ×8 IMPLANT
SUT MONOCRYL 3 0 RB1 (SUTURE) ×4
SUT VIC AB 0 CT1 27 (SUTURE) ×4
SUT VIC AB 0 CT1 27XBRD ANTBC (SUTURE) ×2 IMPLANT
SUT VIC AB 0 UR5 27 (SUTURE) ×4 IMPLANT
SUT VIC AB 2-0 SH 27 (SUTURE) ×4
SUT VIC AB 2-0 SH 27X BRD (SUTURE) ×2 IMPLANT
SUT VICRYL 0 UR6 27IN ABS (SUTURE) ×8 IMPLANT
SYR 27GX1/2 1ML LL SAFETY (SYRINGE) ×4 IMPLANT
TOWEL OR 17X26 10 PK STRL BLUE (TOWEL DISPOSABLE) ×4 IMPLANT
TOWEL OR NON WOVEN STRL DISP B (DISPOSABLE) ×4 IMPLANT
TUBING INSUFFLATION 10FT LAP (TUBING) IMPLANT
WATER STERILE IRR 1500ML POUR (IV SOLUTION) ×4 IMPLANT

## 2017-01-05 NOTE — Anesthesia Postprocedure Evaluation (Signed)
Anesthesia Post Note  Patient: Charles [name]  Procedure(s) Performed: Procedure(s) (LRB): XI ROBOTIC ASSISTED LAPAROSCOPIC RADICAL PROSTATECTOMY LEVEL 3 (N/A) PELVIC LYMPHADENECTOMY (Bilateral)  Patient location during evaluation: PACU Anesthesia Type: General Level of consciousness: awake and alert Pain management: pain level controlled Vital Signs Assessment: post-procedure vital signs reviewed and stable Respiratory status: spontaneous breathing, nonlabored ventilation, respiratory function stable and patient connected to nasal cannula oxygen Cardiovascular status: blood pressure returned to baseline and stable Postop Assessment: no signs of nausea or vomiting Anesthetic complications: no       Last Vitals:  Vitals:   01/05/17 1842 01/05/17 2016  BP: 132/78 120/77  Pulse: (!) 110 (!) 105  Resp: 16 17  Temp: 36.8 C 36.5 C    Last Pain:  Vitals:   01/05/17 2016  TempSrc: Oral  PainSc:                  Catalina Gravel

## 2017-01-05 NOTE — Anesthesia Procedure Notes (Signed)
Procedure Name: Intubation Date/Time: 01/05/2017 12:13 PM Performed by: Danley Danker L Patient Re-evaluated:Patient Re-evaluated prior to inductionOxygen Delivery Method: Circle system utilized Preoxygenation: Pre-oxygenation with 100% oxygen Intubation Type: IV induction Ventilation: Mask ventilation without difficulty and Oral airway inserted - appropriate to patient size Laryngoscope Size: Miller and 3 Grade View: Grade I Tube size: 8.0 mm Number of attempts: 1 Airway Equipment and Method: Stylet Placement Confirmation: ETT inserted through vocal cords under direct vision and breath sounds checked- equal and bilateral Secured at: 22 cm Tube secured with: Tape Dental Injury: Teeth and Oropharynx as per pre-operative assessment

## 2017-01-05 NOTE — Progress Notes (Signed)
Patient had complaint of extreme pain at his bladder and penis. A small clot was noted in the tubing of the foley. Paged urology, recieved call back from NP Jimmey Ralph whom gave a verbal order to hand irrigate.   Hand irrigation completed, no clots returned, however urine began to flow and patient expressed relief. Will continue to monitor.  Charles [name]

## 2017-01-05 NOTE — Anesthesia Preprocedure Evaluation (Addendum)
Anesthesia Evaluation  Patient identified by MRN, date of birth, ID band Patient awake    Reviewed: Allergy & Precautions, NPO status , Patient's Chart, lab work & pertinent test results  Airway Mallampati: II  TM Distance: >3 FB Neck ROM: Full    Dental no notable dental hx.    Pulmonary neg pulmonary ROS,    Pulmonary exam normal breath sounds clear to auscultation       Cardiovascular +CHF  Normal cardiovascular exam Rhythm:Regular Rate:Normal  Left ventricle: The cavity size was mildly dilated. Wall   thickness was normal. Systolic function was mildly to moderately   reduced. The estimated ejection fraction was in the range of 40%   to 45%. Diffuse hypokinesis.  Impressions:  - Mild to moderate global reduction in LV function; probable grade   1 diastolic dysfunction; trace MR and TR.    Neuro/Psych negative neurological ROS  negative psych ROS   GI/Hepatic negative GI ROS, Neg liver ROS,   Endo/Other  negative endocrine ROS  Renal/GU negative Renal ROS  negative genitourinary   Musculoskeletal negative musculoskeletal ROS (+)   Abdominal   Peds negative pediatric ROS (+)  Hematology negative hematology ROS (+)   Anesthesia Other Findings   Reproductive/Obstetrics negative OB ROS                            Anesthesia Physical Anesthesia Plan  ASA: III  Anesthesia Plan: General   Post-op Pain Management:    Induction: Intravenous  Airway Management Planned: Oral ETT  Additional Equipment:   Intra-op Plan:   Post-operative Plan: Extubation in OR  Informed Consent: I have reviewed the patients History and Physical, chart, labs and discussed the procedure including the risks, benefits and alternatives for the proposed anesthesia with the patient or authorized representative who has indicated his/her understanding and acceptance.   Dental advisory given  Plan  Discussed with: CRNA and Surgeon  Anesthesia Plan Comments:        Anesthesia Quick Evaluation

## 2017-01-05 NOTE — Progress Notes (Signed)
Patient ID: Charles [name], male   DOB: 1961/03/28, 56 y.o.   MRN: [id number]  Post-op note  Subjective: The patient is doing well.  No complaints.  Objective: Vital signs in last 24 hours: Temp:  [97.5 F (36.4 C)-98.3 F (36.8 C)] 98.2 F (36.8 C) (01/18 1815) Pulse Rate:  [81-113] 113 (01/18 1830) Resp:  [11-18] 15 (01/18 1830) BP: (107-137)/(70-83) 137/79 (01/18 1830) SpO2:  [96 %-99 %] 96 % (01/18 1830) Weight:  [93.9 kg (207 lb)] 93.9 kg (207 lb) (01/18 0936)  Intake/Output from previous day: No intake/output data recorded. Intake/Output this shift: Total I/O In: 2250 [I.V.:2250] Out: 645 [Urine:45; Drains:100; Other:250; Blood:250]  Physical Exam:  General: Alert and oriented. Abdomen: Soft, Nondistended. Incisions: Clean and dry.  Lab Results:  Recent Labs  01/05/17 1722  HGB 12.4*  HCT 36.0*    Assessment/Plan: POD#0   1) Continue to monitor, ambulate, IS   Pryor Curia. MD   LOS: 1 day   Charles [name],LES 01/05/2017, 6:42 PM

## 2017-01-05 NOTE — Op Note (Signed)
Preoperative diagnosis: Clinically localized adenocarcinoma of the prostate (clinical stage T3a N0 M0)  Postoperative diagnosis: Clinically localized adenocarcinoma of the prostate (clinical stage T3a N0 M0)  Procedure:  1. Robotic assisted laparoscopic radical prostatectomy (Non nerve sparing) 2. Bilateral robotic assisted laparoscopic extended pelvic lymphadenectomy  Surgeon: Pryor Curia. M.D.  Assistant(s): Dr. Virginia Crews  Anesthesia: General  Complications: None  EBL: 250 mL  IVF:  900 mL crystalloid  Specimens: 1. Prostate and seminal vesicles 2. Right pelvic lymph nodes 3. Left pelvic lymph nodes  Disposition of specimens: Pathology  Drains: 1. 20 Fr coude catheter 2. # 19 Blake pelvic drain  Indication: Charles [name] is a 56 y.o. patient with high risk, locally advanced prostate cancer.  After a thorough review of the management options for treatment of prostate cancer, he elected to proceed with surgical therapy and the above procedure(s).  We have discussed the potential benefits and risks of the procedure, side effects of the proposed treatment, the likelihood of the patient achieving the goals of the procedure, and any potential problems that might occur during the procedure or recuperation. Informed consent has been obtained.  Description of procedure:  The patient was taken to the operating room and a general anesthetic was administered. He was given preoperative antibiotics, placed in the dorsal lithotomy position, and prepped and draped in the usual sterile fashion. Next a preoperative timeout was performed. A urethral catheter was placed into the bladder and a site was selected near the umbilicus for placement of the camera port. This was placed using a standard open Hassan technique which allowed entry into the peritoneal cavity under direct vision and without difficulty. An 8 mm port was placed and a pneumoperitoneum established. The camera was then  used to inspect the abdomen and there was no evidence of any intra-abdominal injuries or other abnormalities. The remaining abdominal ports were then placed. 8 mm robotic ports were placed in the right lower quadrant, left lower quadrant, and far left lateral abdominal wall. A 5 mm port was placed in the right upper quadrant and a 12 mm port was placed in the right lateral abdominal wall for laparoscopic assistance. All ports were placed under direct vision without difficulty. The surgical cart was then docked.   Utilizing the cautery scissors, the bladder was reflected posteriorly allowing entry into the space of Retzius and identification of the endopelvic fascia and prostate. The periprostatic fat was then removed from the prostate allowing full exposure of the endopelvic fascia. The endopelvic fascia was then incised from the apex back to the base of the prostate bilaterally and the underlying levator muscle fibers were swept laterally off the prostate thereby isolating the dorsal venous complex. The dorsal vein was then stapled and divided with a 45 mm Flex Echelon stapler. Attention then turned to the bladder neck which was divided anteriorly thereby allowing entry into the bladder and exposure of the urethral catheter. The catheter balloon was deflated and the catheter was brought into the operative field and used to retract the prostate anteriorly. The prostatic urethra was examined and their was evidence of right sided intraurethral extension of what appeared to be locally advanced malignant tissue.  This was grossly excised with the prostate. The posterior bladder neck was then examined and was divided allowing further dissection between the bladder and prostate posteriorly until the vasa deferentia and seminal vessels were identified. There was noted to be dense tissue on the right side of the bladder neck.  I  redirected the dissection and more widely excised the right sided tissue in this area based on  intraoperative findings.  Again, all grossly visible tumor was excised. The vasa deferentia were isolated, divided, and lifted anteriorly. The seminal vesicles were dissected down to their tips with care to control the seminal vascular arterial blood supply. These structures were then lifted anteriorly and the space between Denonvillier's fascia and the anterior rectum was developed with a combination of sharp and blunt dissection. This isolated the vascular pedicles of the prostate.  A wide non nerve sparing dissection was performed with Weck clips used to ligate the vascular pedicles of the prostate bilaterally. The vascular pedicles of the prostate were then divided.  The urethra was then sharply transected allowing the prostate specimen to be disarticulated. The pelvis was copiously irrigated and hemostasis was ensured. There was no evidence for rectal injury.  Attention then turned to the right pelvic sidewall. The fibrofatty tissue extending from the genitofemoral nerve laterally to the confluence of the iliac vessels proximally to the hypogastric artery posteriorly and Cooper's ligament distally was dissected free from the pelvic sidewall with care to preserve the obturator nerve and major vascular structures. Weck clips were used for lymphostasis and hemostasis. An identical procedure was then performed on the contralateral side and the lymphatic packets were removed for permanent pathologic analysis.  Attention then turned to the urethral anastomosis. A 2-0 Vicryl slip knot was placed between Denonvillier's fascia, the posterior bladder neck, and the posterior urethra to reapproximate these structures. A double-armed 3-0 Monocryl suture was then used to perform a 360 running tension-free anastomosis between the bladder neck and urethra. A new urethral catheter was then placed into the bladder and irrigated. There were no blood clots within the bladder and the anastomosis appeared to be watertight.  A #19 Blake drain was then brought through the left lateral 8 mm port site and positioned appropriately within the pelvis. It was secured to the skin with a nylon suture. The surgical cart was then undocked. The right lateral 12 mm port site was closed at the fascial level with a 0 Vicryl suture placed laparoscopically. All remaining ports were then removed under direct vision. The prostate specimen was removed intact within the Endopouch retrieval bag via the periumbilical camera port site. This fascial opening was closed with two running 0 Vicryl sutures. 0.25% Marcaine was then injected into all port sites and all incisions were reapproximated at the skin level with staples. Sterile dressings were applied. The patient appeared to tolerate the procedure well and without complications. The patient was able to be extubated and transferred to the recovery unit in satisfactory condition.  Pryor Curia MD

## 2017-01-05 NOTE — Discharge Summary (Signed)
Date of admission: 01/05/2017  Date of discharge: 01/07/2017  Admission diagnosis: Prostate Cancer  Discharge diagnosis: Prostate Cancer  History and Physical: For full details, please see admission history and physical. Briefly, Charles [name] is a 56 y.o. gentleman with localized prostate cancer.  After discussing management/treatment options, he elected to proceed with surgical treatment.  Hospital Course: DELRICK DEHART was taken to the operating room on 01/05/2017 and underwent a robotic assisted laparoscopic radical prostatectomy. He tolerated this procedure well and without complications. Postoperatively, he was able to be transferred to a regular hospital room following recovery from anesthesia.  He was able to begin ambulating the night of surgery. He remained hemodynamically stable overnight.  He had excellent urine output with elevated output from his pelvic drain. A JP creatinine was checked on POD#1 and was elevated. The foley was placed on slight traction with downtrending JP output. However, JP creatinine was persistently elevated on POD#2.  He was transitioned to oral pain medication, tolerated a regular diet, and had met all discharge criteria and was able to be discharged home later on POD#2. He will record drain and urine output at home and follow up in clinic this week to discuss removal of drain.  Laboratory values:   Recent Labs  01/05/17 1722 01/06/17 0500 01/06/17 1337  HGB 12.4* 10.9* 11.5*  HCT 36.0* 31.7* 33.2*    Disposition: Home  Discharge instruction: He was instructed to be ambulatory but to refrain from heavy lifting, strenuous activity, or driving. He was instructed on urethral catheter care.  Discharge medications:   Allergies as of 01/07/2017   No Known Allergies     Medication List    STOP taking these medications   tamsulosin 0.4 MG Caps capsule Commonly known as:  FLOMAX     TAKE these medications   atorvastatin 10 MG tablet Commonly  known as:  LIPITOR Take 10 mg by mouth at bedtime.   ibuprofen 200 MG tablet Commonly known as:  ADVIL,MOTRIN Take 400 mg by mouth every 8 (eight) hours as needed (for pain.).   losartan 50 MG tablet Commonly known as:  COZAAR Take 50 mg by mouth at bedtime.   metoprolol succinate 25 MG 24 hr tablet Commonly known as:  TOPROL-XL Take 25 mg by mouth at bedtime.   omeprazole 40 MG capsule Commonly known as:  PRILOSEC Take 40 mg by mouth at bedtime.   oxybutynin 5 MG tablet Commonly known as:  DITROPAN Take 1 tablet (5 mg total) by mouth every 8 (eight) hours as needed for bladder spasms.   oxyCODONE-acetaminophen 5-325 MG tablet Commonly known as:  ROXICET Take 1-2 tablets by mouth every 4 (four) hours as needed for severe pain.   spironolactone 25 MG tablet Commonly known as:  ALDACTONE Take 12.5 mg by mouth at bedtime.   sulfamethoxazole-trimethoprim 800-160 MG tablet Commonly known as:  BACTRIM DS,SEPTRA DS Take 1 tablet by mouth 2 (two) times daily. Start day prior to catheter removal Start taking on:  01/12/2017       Followup: He will followup this week for possible drain removal and to discuss his surgical pathology results.

## 2017-01-05 NOTE — Transfer of Care (Signed)
Immediate Anesthesia Transfer of Care Note  Patient: Charles [name]  Procedure(s) Performed: Procedure(s): XI ROBOTIC ASSISTED LAPAROSCOPIC RADICAL PROSTATECTOMY LEVEL 3 (N/A) PELVIC LYMPHADENECTOMY (Bilateral)  Patient Location: PACU  Anesthesia Type:General  Level of Consciousness: awake, oriented, patient cooperative, lethargic and responds to stimulation  Airway & Oxygen Therapy: Patient Spontanous Breathing and Patient connected to face mask oxygen  Post-op Assessment: Report given to RN, Post -op Vital signs reviewed and stable and Patient moving all extremities  Post vital signs: Reviewed and stable  Last Vitals:  Vitals:   01/05/17 0925  BP: 107/70  Pulse: 81  Resp: 18  Temp: 36.4 C    Last Pain:  Vitals:   01/05/17 0936  TempSrc:   PainSc: 2       Patients Stated Pain Goal: 3 ([phone] [id number])  Complications: No apparent anesthesia complications

## 2017-01-06 ENCOUNTER — Encounter (HOSPITAL_COMMUNITY): Payer: Self-pay | Admitting: Urology

## 2017-01-06 DIAGNOSIS — K219 Gastro-esophageal reflux disease without esophagitis: Secondary | ICD-10-CM | POA: Diagnosis not present

## 2017-01-06 DIAGNOSIS — I428 Other cardiomyopathies: Secondary | ICD-10-CM | POA: Diagnosis not present

## 2017-01-06 DIAGNOSIS — C61 Malignant neoplasm of prostate: Secondary | ICD-10-CM | POA: Diagnosis not present

## 2017-01-06 DIAGNOSIS — R3912 Poor urinary stream: Secondary | ICD-10-CM | POA: Diagnosis not present

## 2017-01-06 DIAGNOSIS — N401 Enlarged prostate with lower urinary tract symptoms: Secondary | ICD-10-CM | POA: Diagnosis not present

## 2017-01-06 DIAGNOSIS — N3289 Other specified disorders of bladder: Secondary | ICD-10-CM | POA: Diagnosis not present

## 2017-01-06 DIAGNOSIS — I509 Heart failure, unspecified: Secondary | ICD-10-CM | POA: Diagnosis not present

## 2017-01-06 DIAGNOSIS — R350 Frequency of micturition: Secondary | ICD-10-CM | POA: Diagnosis not present

## 2017-01-06 LAB — BASIC METABOLIC PANEL
ANION GAP: 4 — AB (ref 5–15)
BUN: 10 mg/dL (ref 6–20)
CO2: 30 mmol/L (ref 22–32)
Calcium: 8.1 mg/dL — ABNORMAL LOW (ref 8.9–10.3)
Chloride: 104 mmol/L (ref 101–111)
Creatinine, Ser: 0.89 mg/dL (ref 0.61–1.24)
GFR calc Af Amer: >60 mL/min (ref >60)
GLUCOSE: 111 mg/dL — AB (ref 65–99)
POTASSIUM: 4.3 mmol/L (ref 3.5–5.1)
Sodium: 138 mmol/L (ref 135–145)

## 2017-01-06 LAB — HEMOGLOBIN AND HEMATOCRIT, BLOOD
HCT: 31.7 % — ABNORMAL LOW (ref 39.0–52.0)
HCT: 33.2 % — ABNORMAL LOW (ref 39.0–52.0)
Hemoglobin: 10.9 g/dL — ABNORMAL LOW (ref 13.0–17.0)
Hemoglobin: 11.5 g/dL — ABNORMAL LOW (ref 13.0–17.0)

## 2017-01-06 LAB — CREATININE, FLUID (PLEURAL, PERITONEAL, JP DRAINAGE): Creat, Fluid: >50 mg/dL

## 2017-01-06 MED ORDER — OXYCODONE HCL 5 MG PO TABS: 10.0000 mg | ORAL_TABLET | ORAL | Status: DC | PRN

## 2017-01-06 MED ORDER — OXYCODONE HCL 5 MG PO TABS
5.0000 mg | ORAL_TABLET | ORAL | Status: DC | PRN
  Administered 2017-01-06 – 2017-01-07 (×6): 5 mg via ORAL
  Filled 2017-01-06 (×6): qty 1

## 2017-01-06 MED ORDER — OXYBUTYNIN CHLORIDE 5 MG PO TABS
5.0000 mg | ORAL_TABLET | Freq: Three times a day (TID) | ORAL | Status: DC | PRN
  Administered 2017-01-06 (×2): 5 mg via ORAL
  Filled 2017-01-06 (×2): qty 1

## 2017-01-06 MED ORDER — BELLADONNA ALKALOIDS-OPIUM 16.2-60 MG RE SUPP
1.0000 | Freq: Four times a day (QID) | RECTAL | Status: DC | PRN
  Administered 2017-01-06: 1 via RECTAL
  Filled 2017-01-06: qty 1

## 2017-01-06 MED ORDER — BISACODYL 10 MG RE SUPP
10.0000 mg | Freq: Once | RECTAL | Status: DC
  Filled 2017-01-06: qty 1

## 2017-01-06 MED ORDER — ACETAMINOPHEN 500 MG PO TABS
1000.0000 mg | ORAL_TABLET | Freq: Four times a day (QID) | ORAL | Status: DC
  Administered 2017-01-06 – 2017-01-07 (×2): 1000 mg via ORAL
  Filled 2017-01-06 (×2): qty 2

## 2017-01-06 MED ORDER — OXYBUTYNIN CHLORIDE 5 MG PO TABS
5.0000 mg | ORAL_TABLET | Freq: Once | ORAL | Status: AC
  Administered 2017-01-06: 5 mg via ORAL
  Filled 2017-01-06: qty 1

## 2017-01-06 MED ORDER — KCL IN DEXTROSE-NACL 20-5-0.45 MEQ/L-%-% IV SOLN
INTRAVENOUS | Status: DC
  Administered 2017-01-06 – 2017-01-07 (×2): via INTRAVENOUS
  Filled 2017-01-06 (×2): qty 1000

## 2017-01-06 NOTE — Progress Notes (Signed)
Patients Charles [name] has began to [name] very rapidly. [name] has been emptied twice in the past hour with an output of 100cc each. Color of output has changed from a deep red to a tinged pink.   Patients urine has also changed to a pale yellow from the initial pink color.  Paged [facility] with concerns. Received call back from NP Charles [name]. Was given verbal order to continue to monitor, since it is close to MD 'am' rounding she will try get in touch with Dr. Windle [name] with these new changes.  Charles [name]

## 2017-01-06 NOTE — Progress Notes (Signed)
1 Day Post-Op Subjective: No nausea or vomiting except one episode of nausea with pain. Pain is adequately controlled except for bladder spasms. He says that if drain starts to fill halfway he also has significant pain. Catheter flushed this AM easily without clots.  Objective: Vital signs in last 24 hours: Temp:  [97.5 F (36.4 C)-98.3 F (36.8 C)] 98.2 F (36.8 C) (01/19 0448) Pulse Rate:  [74-113] 74 (01/19 0448) Resp:  [11-18] 18 (01/19 0448) BP: (107-137)/(60-83) 107/60 (01/19 0448) SpO2:  [93 %-99 %] 99 % (01/19 0448) Weight:  [93.9 kg (207 lb)] 93.9 kg (207 lb) (01/18 0936)  Intake/Output from previous day: 01/18 0701 - 01/19 0700 In: 3900 [I.V.:3900] Out: 3780 [Urine:2195; Drains:1085; Blood:250] Intake/Output this shift: No intake/output data recorded.  Physical Exam:  General: Alert and oriented. CV: RRR Lungs: normal work of breathing GI: Soft, Nondistended. Incisions: Clean, dry, and intact. JP with pink tinged fluid. Urine: Clear Extremities: Nontender, no erythema, no edema.  Lab Results:  Recent Labs  01/05/17 1722 01/06/17 0500  HGB 12.4* 10.9*  HCT 36.0* 31.7*      Assessment/Plan: POD# 1 s/p robotic prostatectomy.  1) SL IVF 2) Ambulate, Incentive spirometry 3) Transition to oral pain medication 4) Ditropan added for bladder spasms 5) Dulcolax suppository 6) Check JP creatinine level 7) Plan for likely discharge tomororw    LOS: 1 day   Lolita Rieger 01/06/2017, 7:02 AM

## 2017-01-06 NOTE — Progress Notes (Signed)
Patient continues to complain of bladder pain. Pt refused B&O suppository. Attempted hand irrigation, once again no clots returned, however patient expressed relief afterwards. Will continue to monitor. Charles [name]

## 2017-01-06 NOTE — Progress Notes (Signed)
Patient ID: Charles [name], male   DOB: 13-Dec-1961, 56 y.o.   MRN: [id number]  Pt with small urine leak based on drain Cr level and appears to be improving with about 400 cc of urine and 75 cc out of the drain since noon.  Will maintain the catheter on slight traction for now.  I encourage him to ambulate and use his IS.  Will monitor overnight.  Will advance diet.

## 2017-01-07 DIAGNOSIS — N401 Enlarged prostate with lower urinary tract symptoms: Secondary | ICD-10-CM | POA: Diagnosis not present

## 2017-01-07 DIAGNOSIS — C61 Malignant neoplasm of prostate: Secondary | ICD-10-CM | POA: Diagnosis not present

## 2017-01-07 DIAGNOSIS — K219 Gastro-esophageal reflux disease without esophagitis: Secondary | ICD-10-CM | POA: Diagnosis not present

## 2017-01-07 DIAGNOSIS — R350 Frequency of micturition: Secondary | ICD-10-CM | POA: Diagnosis not present

## 2017-01-07 DIAGNOSIS — I509 Heart failure, unspecified: Secondary | ICD-10-CM | POA: Diagnosis not present

## 2017-01-07 DIAGNOSIS — R3912 Poor urinary stream: Secondary | ICD-10-CM | POA: Diagnosis not present

## 2017-01-07 DIAGNOSIS — N3289 Other specified disorders of bladder: Secondary | ICD-10-CM | POA: Diagnosis not present

## 2017-01-07 DIAGNOSIS — I428 Other cardiomyopathies: Secondary | ICD-10-CM | POA: Diagnosis not present

## 2017-01-07 LAB — BASIC METABOLIC PANEL
ANION GAP: 4 — AB (ref 5–15)
BUN: 10 mg/dL (ref 6–20)
CALCIUM: 8.1 mg/dL — AB (ref 8.9–10.3)
CO2: 28 mmol/L (ref 22–32)
Chloride: 105 mmol/L (ref 101–111)
Creatinine, Ser: 1.01 mg/dL (ref 0.61–1.24)
GFR calc Af Amer: >60 mL/min (ref >60)
Glucose, Bld: 110 mg/dL — ABNORMAL HIGH (ref 65–99)
POTASSIUM: 4.8 mmol/L (ref 3.5–5.1)
Sodium: 137 mmol/L (ref 135–145)

## 2017-01-07 LAB — CREATININE, FLUID (PLEURAL, PERITONEAL, JP DRAINAGE): Creat, Fluid: >50 mg/dL

## 2017-01-07 MED ORDER — OXYBUTYNIN CHLORIDE 5 MG PO TABS: 5.0000 mg | ORAL_TABLET | Freq: Three times a day (TID) | ORAL | 0 refills | Status: AC | PRN

## 2017-01-07 NOTE — Progress Notes (Signed)
2 Days Post-Op Subjective: No nausea or vomiting. Pain is adequately controlled, with improving lower quadrant pain after ambulating yesterday. Passing small amount of flatus. Drain output downtrending with foley on traction. Creatinine stable.  Objective: Vital signs in last 24 hours: Temp:  [97.9 F (36.6 C)-98.4 F (36.9 C)] 98.1 F (36.7 C) (01/20 0545) Pulse Rate:  [68-75] 72 (01/20 0545) Resp:  [14-18] 18 (01/20 0545) BP: (108-117)/(66-75) 108/68 (01/20 0545) SpO2:  [95 %-99 %] 96 % (01/20 0545)  Intake/Output from previous day: 01/19 0701 - 01/20 0700 In: 2588.8 [P.O.:960; I.V.:1628.8] Out: 2725 [Urine:2260; Drains:465] Intake/Output this shift: No intake/output data recorded.  Physical Exam:  General: Alert and oriented. CV: RRR Lungs: normal work of breathing GI: Soft, Nondistended. Incisions: Clean, dry, and intact. JP with pink tinged fluid. Urine: Clear Extremities: Nontender, no erythema, no edema.  Lab Results:  Recent Labs  01/05/17 1722 01/06/17 0500 01/06/17 1337  HGB 12.4* 10.9* 11.5*  HCT 36.0* 31.7* 33.2*      Assessment/Plan: POD# 2 s/p robotic prostatectomy with small urine leak, resolving with conservative management.  1) SL IVF 2) Ambulate, Incentive spirometry 3) Oral pain medication 4) Ditropan PRN for bladder spasms 5) Recheck JP creatinine level - can consider removing drain prior to discharge if normal. Otherwise will likely discharge with drain in place and record outputs    LOS: 1 day   Charles [name] 01/07/2017, 8:23 AM

## 2017-01-16 DIAGNOSIS — N393 Stress incontinence (female) (male): Secondary | ICD-10-CM | POA: Diagnosis not present

## 2017-01-16 DIAGNOSIS — C61 Malignant neoplasm of prostate: Secondary | ICD-10-CM | POA: Diagnosis not present

## 2017-02-03 DIAGNOSIS — M62838 Other muscle spasm: Secondary | ICD-10-CM | POA: Diagnosis not present

## 2017-02-03 DIAGNOSIS — N393 Stress incontinence (female) (male): Secondary | ICD-10-CM | POA: Diagnosis not present

## 2017-02-03 DIAGNOSIS — M6281 Muscle weakness (generalized): Secondary | ICD-10-CM | POA: Diagnosis not present

## 2017-02-10 DIAGNOSIS — C61 Malignant neoplasm of prostate: Secondary | ICD-10-CM | POA: Diagnosis not present

## 2017-02-17 DIAGNOSIS — M6281 Muscle weakness (generalized): Secondary | ICD-10-CM | POA: Diagnosis not present

## 2017-02-17 DIAGNOSIS — M62838 Other muscle spasm: Secondary | ICD-10-CM | POA: Diagnosis not present

## 2017-02-17 DIAGNOSIS — N393 Stress incontinence (female) (male): Secondary | ICD-10-CM | POA: Diagnosis not present

## 2017-03-22 DIAGNOSIS — N393 Stress incontinence (female) (male): Secondary | ICD-10-CM | POA: Diagnosis not present

## 2017-03-22 DIAGNOSIS — M6281 Muscle weakness (generalized): Secondary | ICD-10-CM | POA: Diagnosis not present

## 2017-03-22 DIAGNOSIS — M62838 Other muscle spasm: Secondary | ICD-10-CM | POA: Diagnosis not present

## 2017-03-28 DIAGNOSIS — C61 Malignant neoplasm of prostate: Secondary | ICD-10-CM | POA: Diagnosis not present

## 2017-04-03 ENCOUNTER — Telehealth: Payer: Self-pay | Admitting: Medical Oncology

## 2017-04-03 NOTE — Progress Notes (Signed)
Left message requesting a return call to discuss referral to the [facility]. 

## 2017-04-04 ENCOUNTER — Encounter: Payer: Self-pay | Admitting: Medical Oncology

## 2017-04-10 ENCOUNTER — Telehealth: Payer: Self-pay | Admitting: Medical Oncology

## 2017-04-10 NOTE — Progress Notes (Signed)
I  introduced myself as the Prostate Nurse Navigator and the Coordinator of the Prostate [place].  1. I confirmed with the patient he is aware of his referral to the clinic 04/11/17 arriving at 12:30 pm  2. I discussed the format of the clinic and the physicians he will be seeing that day.  3. I discussed where the clinic is located and how to contact me.  4. I confirmed his address and informed him I would be mailing a packet of information and forms to be completed. I asked him to bring them with him the day of his appointment.   He voiced understanding of the above. I asked him to call me if he has any questions or concerns regarding his appointments or the forms he needs to complete.

## 2017-04-10 NOTE — Progress Notes (Signed)
Left a message to remind Mr. Charles [name] of appointment for Prostate [facility] 04/11/17 arriving at 12:pm. I reminded him to bring his completed medical forms and to have lunch before arrival. I asked him to call with questions or concerns.

## 2017-04-10 NOTE — Progress Notes (Signed)
Requested pathology slides from [facility].

## 2017-04-11 ENCOUNTER — Encounter: Payer: Self-pay | Admitting: Radiation Oncology

## 2017-04-11 ENCOUNTER — Encounter: Payer: Self-pay | Admitting: Medical Oncology

## 2017-04-11 ENCOUNTER — Ambulatory Visit
Admission: RE | Admit: 2017-04-11 | Discharge: 2017-04-11 | Disposition: A | Discharge status: Home / Self Care | Payer: BLUE CROSS/BLUE SHIELD | Source: Ambulatory Visit | Attending: Radiation Oncology | Admitting: Radiation Oncology

## 2017-04-11 ENCOUNTER — Ambulatory Visit (HOSPITAL_BASED_OUTPATIENT_CLINIC_OR_DEPARTMENT_OTHER): Payer: BLUE CROSS/BLUE SHIELD | Admitting: Oncology

## 2017-04-11 VITALS — BP 114/71 | HR 68 | Resp 16 | Ht 68.0 in | Wt 219.0 lb

## 2017-04-11 DIAGNOSIS — I429 Cardiomyopathy, unspecified: Secondary | ICD-10-CM | POA: Diagnosis not present

## 2017-04-11 DIAGNOSIS — Z9079 Acquired absence of other genital organ(s): Secondary | ICD-10-CM | POA: Diagnosis not present

## 2017-04-11 DIAGNOSIS — Z9049 Acquired absence of other specified parts of digestive tract: Secondary | ICD-10-CM | POA: Diagnosis not present

## 2017-04-11 DIAGNOSIS — C61 Malignant neoplasm of prostate: Secondary | ICD-10-CM

## 2017-04-11 DIAGNOSIS — N393 Stress incontinence (female) (male): Secondary | ICD-10-CM | POA: Diagnosis not present

## 2017-04-11 DIAGNOSIS — N5201 Erectile dysfunction due to arterial insufficiency: Secondary | ICD-10-CM | POA: Diagnosis not present

## 2017-04-11 DIAGNOSIS — Z9889 Other specified postprocedural states: Secondary | ICD-10-CM | POA: Insufficient documentation

## 2017-04-11 DIAGNOSIS — M62838 Other muscle spasm: Secondary | ICD-10-CM | POA: Diagnosis not present

## 2017-04-11 DIAGNOSIS — K219 Gastro-esophageal reflux disease without esophagitis: Secondary | ICD-10-CM | POA: Diagnosis not present

## 2017-04-11 DIAGNOSIS — M6281 Muscle weakness (generalized): Secondary | ICD-10-CM | POA: Diagnosis not present

## 2017-04-11 HISTORY — DX: Malignant neoplasm of prostate: C61

## 2017-04-11 NOTE — Progress Notes (Signed)
Reason for Referral: Prostate cancer.   HPI: Charles [name] is a pleasant 56 year old gentleman currently of [place]. He is a rather healthy gentleman with history of nonischemic cardiomyopathy that was noted to have an elevated PSA up to 12.7. He underwent a biopsy by Dr. Alinda Money which confirmed the presence of high-grade, high volume disease with a Gleason score of 5+5 = 10 in multiple cores. He subsequently underwent a radical prostatectomy on 01/05/2017. The final pathology showed a T3b N0 Gleason score 5+4 = 9 with multiple positive margins. He recovered reasonably well at this time but does have issues with incontinence. He he regained most activities of daily living including have returned back to work. He denied any hematuria or dysuria. He does report stress incontinence especially with work and activity.  He does not report any headaches, blurry vision, syncope or seizures. He does not report any fevers, chills or sweats. He does not report any cough, wheezing or hemoptysis. He does not report any nausea, vomiting or abdominal pain. He is not reporting frequency urgency or hesitancy. He does not report any skeletal complaints. Remaining review of system is unremarkable.   Past Medical History:  Diagnosis Date  . GERD (gastroesophageal reflux disease)   . Medical history non-contributory   . Nonischemic cardiomyopathy ([place]) 12/10/2015  . Prostate cancer [facility])   :  Past Surgical History:  Procedure Laterality Date  . CHOLECYSTECTOMY N/A 05/22/2014   Procedure: LAPAROSCOPIC CHOLECYSTECTOMY WITH INTRAOPERATIVE CHOLANGIOGRAM;  Surgeon: Adin Hector, MD;  Location: [facility];  Service: General;  Laterality: N/A;  . LYMPHADENECTOMY Bilateral 01/05/2017   Procedure: PELVIC LYMPHADENECTOMY;  Surgeon: Raynelle Bring, MD;  Location: [facility];  Service: Urology;  Laterality: Bilateral;  . PROSTATE BIOPSY    . ROBOT ASSISTED LAPAROSCOPIC RADICAL PROSTATECTOMY N/A 01/05/2017   Procedure: XI ROBOTIC ASSISTED LAPAROSCOPIC RADICAL PROSTATECTOMY LEVEL 3;  Surgeon: Raynelle Bring, MD;  Location: [facility];  Service: Urology;  Laterality: N/A;  . VASECTOMY    . VASECTOMY REVERSAL  2001  :   Current Outpatient Prescriptions:  .  atorvastatin (LIPITOR) 10 MG tablet, Take 10 mg by mouth at bedtime. , Disp: , Rfl:  .  ibuprofen (ADVIL,MOTRIN) 200 MG tablet, Take 400 mg by mouth every 8 (eight) hours as needed (for pain.)., Disp: , Rfl:  .  losartan (COZAAR) 50 MG tablet, Take 50 mg by mouth at bedtime. , Disp: , Rfl:  .  metoprolol succinate (TOPROL-XL) 25 MG 24 hr tablet, Take 25 mg by mouth at bedtime. , Disp: , Rfl:  .  omeprazole (PRILOSEC) 40 MG capsule, Take 40 mg by mouth at bedtime. , Disp: , Rfl:  .  oxyCODONE-acetaminophen (ROXICET) 5-325 MG tablet, Take 1-2 tablets by mouth every 4 (four) hours as needed for severe pain., Disp: 30 tablet, Rfl: 0 .  spironolactone (ALDACTONE) 25 MG tablet, Take 12.5 mg by mouth at bedtime., Disp: , Rfl: :  No Known Allergies:  Family History  Problem Relation Age of Onset  . Cancer Neg Hx   :  Social History   Social History  . Marital status: Married    Spouse name: N/A  . Number of children: N/A  . Years of education: N/A   Occupational History  . Not on file.   Social History Main Topics  . Smoking status: Never Smoker  . Smokeless tobacco: Never Used  . Alcohol use No  . Drug use: No  . Sexual activity: Yes   Other Topics Concern  .  Not on file   Social History Narrative  . No narrative on file  :  Pertinent items are noted in HPI.  Exam: ECOG 0 General appearance: alert and cooperative appeared without distress. Throat: No oral thrush or ulcers. Neck: no adenopathy Back: negative Resp: clear to auscultation bilaterally Cardio: regular rate and rhythm, S1, S2 normal, no murmur, click, rub or gallop GI: soft, non-tender; bowel sounds normal; no masses,  no organomegaly Extremities: extremities  normal, atraumatic, no cyanosis or edema Pulses: 2+ and symmetric Skin: Skin color, texture, turgor normal. No rashes or lesions  CBC    Component Value Date/Time   WBC 6.1 12/28/2016 1000   RBC 4.48 12/28/2016 1000   HGB 11.5 (L) 01/06/2017 1337   HCT 33.2 (L) 01/06/2017 1337   PLT 240 12/28/2016 1000   MCV 87.5 12/28/2016 1000   MCH 29.2 12/28/2016 1000   MCHC 33.4 12/28/2016 1000   RDW 13.0 12/28/2016 1000     Chemistry      Component Value Date/Time   NA 137 01/07/2017 0537   K 4.8 01/07/2017 0537   CL 105 01/07/2017 0537   CO2 28 01/07/2017 0537   BUN 10 01/07/2017 0537   CREATININE 1.01 01/07/2017 0537      Component Value Date/Time   CALCIUM 8.1 (L) 01/07/2017 0537       Assessment and Plan:   56 year old gentleman with prostate cancer diagnosed in 2017. He initially presented with rising PSA up to 12.7. Biopsy of that time showed a Gleason score 5+5 = 10. He underwent a prostatectomy by Dr. Alinda Money on 01/05/2017. The final pathology revealed T3b N0 and a Gleason score 5+4 = 9. His most recent PSA was 0.058 in April 2018.  His case was discussed today in the prostate cancer multidisciplinary clinic including review of his pathology specimen with the reviewing pathologist. He is currently has high volume disease with positive margins with high risk of recurrence locally as well as systemically. He would benefit from adjuvant radiation therapy given his positive margins. I discussed the role of androgen deprivation therapy in this case and 6 months of androgen deprivation therapy would be beneficial given his high-risk disease. Complications associated with this therapy includes hot flashes, weight gain, erectile dysfunction among others.  He is agreeable to proceed with this therapy at this time I will start with androgen deprivation therapy only and radiation will be added once his continence has improved moving forward. He understands he has high risk of developing  prostatic disease and systemic therapy may be needed at that time for longer-term if that happens.

## 2017-04-11 NOTE — Progress Notes (Signed)
GU Location of Tumor / Histology: prostatic adenocarcinoma  If Prostate Cancer, Gleason Score is (5 + 4) and PSA is (12.7) pretreatment.   Charles [name] had a radical prostatectomy on 01/05/17.    Past/Anticipated interventions by urology, if any: prostatectomy, referral to [facility]  Past/Anticipated interventions by medical oncology, if any: no  Weight changes, if any: no  Bowel/Bladder complaints, if any: wearing 1 pad per day mostly for protective purposes. Discomfort toward base of penis relieved with urination.   Nausea/Vomiting, if any: no  Pain issues, if any:  Discomfort toward the base of the penis relieved with urination.   SAFETY ISSUES:  Prior radiation? no  Pacemaker/ICD? ?  Possible current pregnancy? no  Is the patient on methotrexate? 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

## 2017-04-11 NOTE — Telephone Encounter (Signed)
Unable to reach patient regarding appointment for Prostate MDC.

## 2017-04-11 NOTE — Progress Notes (Signed)
Radiation Oncology         [phone] ________________________________  Multidisciplinary Prostate Cancer Clinic  Initial Radiation Oncology Consultation  Name: Charles [name] MRN: [id number]  Date: 04/11/2017  DOB: August 09, 1961  IO:[id number] [name], MD  Shamleffer, Frederik Schmidt*   REFERRING PHYSICIAN: Shamleffer, Ibethal Jar*  DIAGNOSIS: 56 y.o. gentleman with stage pT3b N0 Mx adenocarcinoma of the prostate with a Gleason's score of 5+5 and a pre-op PSA of 12.7.  He is s/p prostatectomy with pathology indicating multiple positive margins, extensive extraprostatic extension with involvment of bilateral SVs and lymphovascular invasion.    ICD-9-CM ICD-10-CM   1. Prostate cancer ([place]) [place] ILLNESS::Charles [name] is a 56 y.o. gentleman.  He was noted to have an elevated PSA of 12.7 by his primary care physician, Dr. Kelton [name].  Accordingly, he was referred for evaluation in urology by Dr. Risa [name] in 10/2016,  digital rectal examination was performed at that time revealing diffuse prostatic induration with a particularly hard nodule on the right lobe and suspicion for extraprostatic extension.  The patient proceeded to transrectal ultrasound with 12 biopsies of the prostate on 11/16/16.  The prostate volume measured 57 cc.  Out of 12 core biopsies,11 were positive.  The maximum Gleason score was 5+5, and this was seen in left base, left lateral apex, left apex, right mid lateral and right lateral apex.  There was 5+4 disease in the right base, right base lateral, right mid, right apex and left mid gland. There was 4+4 in the left lateral base.   CT A/P 11/24/17 showed a 74m preaortic lymph node but otherwise negative for metastatic disease. Bone scan 11/24/17 was negative for metastatic disease as well.  He is s/p radical prostatectomy with BPLND on 01/05/17.  Pathology showed extensive extraprostatic extension at the apex, mid and base bilaterally, bilateral  seminal vesicles and lymphovascular invasion.  Additionally, there were multiple positive margins with Gleason 5+5 at bilateral apex, right posterior mid, bilateral posterior base and right anterior near bladder neck.  12/12 lymph nodes were negative for malignancy.  PSA 02/10/17 was 0.036 and most recent PSA 03/28/17 was 0.058.  He has been working with physical therapy to assist with regaining bladder control and had gotten down to requiring only 1ppd.  However, since returning back to work and his routine daily activities, he has noted the need for 2-3 pads/day at this point.  The patient reviewed the pathology results with his urologist and he has kindly been referred today to the multidisciplinary prostate cancer clinic for presentation of pathology and radiology studies in our conference for discussion of potential salvage radiation treatment options and clinical evaluation.    PREVIOUS RADIATION THERAPY: No  PAST MEDICAL HISTORY:  has a past medical history of GERD (gastroesophageal reflux disease); Medical history non-contributory; Nonischemic cardiomyopathy ([place] (12/10/2015); and Prostate cancer ([place].    PAST SURGICAL HISTORY: Past Surgical History:  Procedure Laterality Date  . CHOLECYSTECTOMY N/A 05/22/2014   Procedure: LAPAROSCOPIC CHOLECYSTECTOMY WITH INTRAOPERATIVE CHOLANGIOGRAM;  Surgeon: HAdin Hector MD;  Location: [place]  Service: General;  Laterality: N/A;  . LYMPHADENECTOMY Bilateral 01/05/2017   Procedure: PELVIC LYMPHADENECTOMY;  Surgeon: LRaynelle Bring MD;  [facility];  Service: Urology;  Laterality: Bilateral;  . PROSTATE BIOPSY    . ROBOT ASSISTED LAPAROSCOPIC RADICAL PROSTATECTOMY N/A 01/05/2017   Procedure: XI ROBOTIC ASSISTED LAPAROSCOPIC RADICAL PROSTATECTOMY LEVEL 3;  Surgeon: LRaynelle Bring MD;  [facility];  Service: Urology;  Laterality: N/A;  . VASECTOMY    . VASECTOMY REVERSAL  2001    FAMILY HISTORY: family history includes  Cancer in his sister.  SOCIAL HISTORY:  reports that he has never smoked. He has never used smokeless tobacco. He reports that he does not drink alcohol or use drugs.  ALLERGIES: Patient has no known allergies.  MEDICATIONS:  Current Outpatient Prescriptions  Medication Sig Dispense Refill  . atorvastatin (LIPITOR) 10 MG tablet Take 10 mg by mouth at bedtime.     Marland Kitchen losartan (COZAAR) 50 MG tablet Take 50 mg by mouth at bedtime.     . metoprolol succinate (TOPROL-XL) 25 MG 24 hr tablet Take 25 mg by mouth at bedtime.     Marland Kitchen omeprazole (PRILOSEC) 40 MG capsule Take 40 mg by mouth at bedtime.     Marland Kitchen spironolactone (ALDACTONE) 25 MG tablet Take 12.5 mg by mouth at bedtime.    Marland Kitchen ibuprofen (ADVIL,MOTRIN) 200 MG tablet Take 400 mg by mouth every 8 (eight) hours as needed (for pain.).     No current facility-administered medications for this encounter.     REVIEW OF SYSTEMS:  On review of systems, the patient reports that he is doing well overall. He denies any chest pain, shortness of breath, cough, fevers, chills, unintended weight changes. He endorses poor circulation, night sweats, hearing loss, and wears hearing aids. Pt wears glasses. He endorses heartburn but denies abdominal pain, nausea or vomiting. He endorses back pain. His IPSS was 21, indicating severe urinary symptoms. He endorses dribbling urine and continued stress incontinence requiring 2-3 pads/day at this point. He is unable to complete sexual activity with any attempts since the time of surgery. A complete review of systems is obtained and is otherwise negative.  PHYSICAL EXAM:  Wt Readings from Last 3 Encounters:  04/11/17 219 lb (99.3 kg)  01/05/17 207 lb (93.9 kg)  12/28/16 207 lb (93.9 kg)   Temp Readings from Last 3 Encounters:  01/07/17 98.1 F (36.7 C) (Oral)  12/28/16 98.6 F (37 C) (Oral)  12/19/16 97.8 F (36.6 C) (Oral)   BP Readings from Last 3 Encounters:  04/11/17 114/71  01/07/17 108/68  12/28/16 114/64    Pulse Readings from Last 3 Encounters:  04/11/17 68  01/07/17 72  12/28/16 79    In general this is a well appearing caucasian male in no acute distress. He is alert and oriented x4 and appropriate throughout the examination. HEENT reveals that the patient is normocephalic, atraumatic. EOMs are intact. PERRLA. Skin is intact without any evidence of gross lesions. Cardiovascular exam reveals a regular rate and rhythm, no clicks rubs or murmurs are auscultated. Chest is clear to auscultation bilaterally. Lymphatic assessment is performed and does not reveal any adenopathy in the cervical, supraclavicular, axillary, or inguinal chains. Abdomen has active bowel sounds in all quadrants and is intact. The abdomen is soft, non tender, non distended. Lower extremities are negative for pretibial pitting edema, deep calf tenderness, cyanosis or clubbing.  KPS = 100  100 - Normal; no complaints; no evidence of disease. 90   - Able to carry on normal activity; minor signs or symptoms of disease. 80   - Normal activity with effort; some signs or symptoms of disease. 71   - Cares for self; unable to carry on normal activity or to do active work. 60   - Requires occasional assistance, but is able to care for most of his personal needs. 50   -  Requires considerable assistance and frequent medical care. 35   - Disabled; requires special care and assistance. 20   - Severely disabled; hospital admission is indicated although death not imminent. 19   - Very sick; hospital admission necessary; active supportive treatment necessary. 10   - Moribund; fatal processes progressing rapidly. 0     - Dead  Karnofsky DA, Abelmann [place], Craver LS and Burchenal [facility] [phone]) The use of the nitrogen mustards in the palliative treatment of carcinoma: with particular reference to bronchogenic carcinoma Cancer 1 [phone]   LABORATORY DATA:  Lab Results  Component Value Date   WBC 6.1 12/28/2016   HGB 11.5 (L) 01/06/2017   HCT  33.2 (L) 01/06/2017   MCV 87.5 12/28/2016   PLT 240 12/28/2016   Lab Results  Component Value Date   NA 137 01/07/2017   K 4.8 01/07/2017   CL 105 01/07/2017   CO2 28 01/07/2017   No results found for: ALT, AST, GGT, ALKPHOS, BILITOT   RADIOGRAPHY: No results found.    IMPRESSION/PLAN: 56 y.o. gentleman with a high risk, stage pT3b N0 Mx adenocarcinoma of the prostate with a Gleason's score of 5+5 and a pre-op PSA of 12.7.  Surgical pathology following radiacal prostatectomy with BPLND shows extensive extraprostatic extension at the apex, mid and base bilaterally, bilateral seminal vesicles and lymphovascular invasion.  Additionally, there were multiple positive margins with Gleason 5+5 at bilateral apex, right posterior mid, bilateral posterior base and right anterior near bladder neck.  12/12 lymph nodes were negative for malignancy.  PSA 02/10/17 was 0.036 and most recently, on 03/28/17 was 0.058.    Today, Dr. Tammi Klippel met with the patient and his wife and reviewed the findings and workup thus far.  We discussed the natural history of prostate cancer.  We reviewed the the implications of positive margins, extracapsular extension, and seminal vesicle involvement on the risk of prostate cancer recurrence. We discussed the fact that his PSA has remained detectable since the time of surgery indicating persistent disease.  We discussed the role for early salvage in patients with evidence of persistent disease on pathology and postoperative detectable PSA, which can lead to excellent results in terms of disease control and survival. We discussed radiation treatment directed to the prostatic fossa with regard to the logistics and delivery of external beam radiation treatment.  The patient would like to proceed with adjuvant radiotherapy.  However, he will need additional time for recovery to further regain bladder control prior to proceeding with EBRT.  The plan at this time would be to proceed with  starting ADT to allow further rehabilitation with physical therapy for pelvic floor training/strengthening.  We will plan to meet back together in 2-3 months to assess his progress with bladder control and readiness to proceed with EBRT at that time.  We will share our findings with Dr. Alinda Money and will look forward to following along in his progress.     We enjoyed meeting with him today, and will look forward to participating in the care of this very nice gentleman.   We spent 60 minutes face to face with the patient and more than 50% of that time was spent in counseling and/or coordination of care.     Nicholos Johns, PA-C  and  Sheral Apley Tammi Klippel, M.D.  This document serves as a record of services personally performed by Tyler Pita, MD and Freeman Caldron, PA-C. It was created on their behalf by Linward Natal, a trained medical scribe.  The creation of this record is based on the scribe's personal observations and the provider's statements to them. This document has been checked and approved by the attending provider.

## 2017-04-11 NOTE — Consult Note (Signed)
Multi-Disciplinary Clinic 04/11/2017    [place]         MRN: [id number]  PRIMARY CARE:  Cloyd Stagers, MD  DOB: 08-Aug-1961, 56 year old Male  REFERRING:  Raynelle Bring, MD  SSN: [id number]  PROVIDER:  Raynelle Bring, M.D.    LOCATION:  [facility] [phone]    CC/HPI: Locally advanced prostate cancer   He returns today to the multidisciplinary clinic over 3 months out from his radical prostatectomy. He has been seeing improvement in his continence. However, as he began working and becoming more active, he noted increased leakage. Currently, he is using approximately 2-3 pads per day when he works. He has fairly minimal leakage at night or when he is inactive. He follows up today after his most recent PSA and to discuss options for adjuvant treatment. The previously noted pain symptoms at the base of his penis when his bladder becomes full is still present but less prevalent and less frequent.     ALLERGIES: No Known Allergies    MEDICATIONS: Metoprolol Succinate  Omeprazole 40 mg capsule,delayed release  Losartan Potassium  Spironolactone     GU PSH: Complex Uroflow - 01/16/2017, 12/27/2016 Inject For cystogram - 01/16/2017 Laparoscopy; Lymphadenectomy - 01/05/2017 Prostate Needle Biopsy - 11/16/2016 Robotic Radical Prostatectomy - 01/05/2017 Vasectomy - 1995      PSH Notes: Vasectomy Reversal 2000   NON-GU PSH: Cholecystectomy (open) - 11/04/2014        GU PMH: Stress Incontinence, M/F - 04/11/2017, - 03/22/2017, - 02/17/2017, - 02/03/2017, - 01/02/2017 ED due to arterial insufficiency - 02/17/2017 Prostate Cancer      PMH Notes:   1) Prostate cancer: He is s/p a NNS RAL radical prostatectomy and BPLND on 01/05/17.   Diagnosis: pT3b N0 Mx, Gleason 5+4=9 adenocarcinoma of the prostate with multiple positive margins  Pretreatment PSA: 12.7  Pretreatment IPSS: 35  Pretreatment SHIM:25   NON-GU PMH: Muscle weakness (generalized) - 04/11/2017, -  03/22/2017, - 02/17/2017, - 02/03/2017, - 01/02/2017, - 12/28/2016 Other muscle spasm - 04/11/2017, - 03/22/2017, - 02/17/2017, - 02/03/2017, - 01/02/2017 Cardiomyopathy, unspecified GERD    FAMILY HISTORY: GSW (gunshot wound) - Father    Notes: No family history of prostate cancer.   SOCIAL HISTORY: Marital Status: Married Current Smoking Status: Patient has never smoked.  Has never drank.  Drinks 4+ caffeinated drinks per day. Patient's occupation Estate manager/land agent.    REVIEW OF SYSTEMS:     GU Review Male:  Patient denies frequent urination, hard to postpone urination, burning/ pain with urination, get up at night to urinate, leakage of urine, stream starts and stops, trouble starting your streams, and have to strain to urinate .    Gastrointestinal (Lower):  Patient denies diarrhea and constipation.    Gastrointestinal (Upper):  Patient denies nausea and vomiting.    Constitutional:  Patient denies fatigue, weight loss, night sweats, and fever.    Skin:  Patient denies skin rash/ lesion and itching.    Eyes:  Patient denies blurred vision and double vision.    Ears/ Nose/ Throat:  Patient denies sore throat and sinus problems.    Hematologic/Lymphatic:  Patient denies swollen glands and easy bruising.    Cardiovascular:  Patient denies leg swelling and chest pains.    Respiratory:  Patient denies cough and shortness of breath.    Endocrine:  Patient denies excessive thirst.    Musculoskeletal:  Patient denies back pain and joint  pain.    Neurological:  Patient denies headaches and dizziness.    Psychologic:  Patient denies depression and anxiety.    VITAL SIGNS: None     MULTI-SYSTEM PHYSICAL EXAMINATION:      Constitutional: Well-nourished. No physical deformities. Normally developed. Good grooming.            PAST DATA REVIEWED:   Source Of History:  Patient  Lab Test Review:  PSA  Records Review:  Pathology Reports    03/28/17 02/10/17  PSA  Total PSA 0.058 ng/dl  0.036 ng/dl    PROCEDURES: None   ASSESSMENT:     ICD-10 Details  1 GU:  Prostate Cancer - C61   2  Stress Incontinence, M/F - N39.3   3  ED due to arterial insufficiency - N52.01    PLAN:   Document  Letter(s):  Created for Patient: Clinical Summary   Created for Patient: Clinical Summary   Created for Patient: Clinical Summary   Notes:  1. Locally advanced prostate cancer: We reviewed his PSA which has marginally increased but certainly remains well below the definition threshold for biochemical recurrence. Considering the very high risk nature of his tumor and multiple positive margins, it was the recommendation of the multidisciplinary clinic to proceed with at least 6 months of androgen deprivation therapy and adjuvant radiation therapy. We reviewed this treatment in detail. We discussed the potential side effects of androgen deprivation and also discussed the appropriate timing of radiation therapy both with regard to his ADT and recovery of continence. He is agreeable to proceed and will be scheduled for a 6 month Lupron injection in the near future.   2. Incontinence: He will continue his pelvic floor muscle training. He is scheduled to see Hilda Blades today and I will make her aware of the desire to be aggressive in order to help him regain continence so he can proceed with radiation therapy. I will plan to have him follow up with me in approximately 2 months to reassess his continence and determine if he is ready to proceed at that time.   3. Erectile dysfunction: He did undergo a non-nerve sparing prostatectomy. He again expresses interest to consider a penile prosthesis before the end of the year for insurance purposes. We again discussed this in the context of him necessitating androgen deprivation. He was agreeable to postpone this decision until later in the year understanding that other aspects of his treatment may affect his final decision.   Cc: Dr. Rana Snare  Dr. Tyler Pita  Dr. Zola Button  Dr. Melanie Crazier Shamleffer   E & M CODE: I spent at least 25 minutes face to face with the patient, more than 50% of that time was spent on counseling and/or coordinating care.

## 2017-04-11 NOTE — Progress Notes (Signed)
                               Care Plan Summary  Name: Charles [name] DOB: 12-14-1961  Your Medical Team:   Urologist -  Dr. Raynelle Bring, [facility]  Radiation Oncologist - Dr. Tyler Pita, [facility]   Medical Oncologist - Dr. Zola Button, [place]  Recommendations: 1) Androgen Deprivation (hormone injection) 2) Follow up with Dr. Alinda Money in 2 months 3) Radiation  * These recommendations are based on information available as of today's consult.      Recommendations may change depending on the results of further tests or exam  Next Steps: 1) Dr. Lynne Logan office will schedule hormone injection 2) Dr. Lynne Logan office will schedule follow up appointment 3) Dr. Johny Shears office  will schedule  Radiation after follow up with Dr. Alinda Money.  When appointments need to be scheduled, you will be contacted by [facility] and/or [facility].  Questions?  Please do not hesitate to call Cira Rue, RN, BSN, OCN at [phone] any questions or concerns.  Shirlean Mylar is your Oncology Nurse Navigator and is available to assist you while you're receiving your medical care at [facility].  Given a folder with "Falls Prevention" sheet and business cards for all providers.

## 2017-04-13 ENCOUNTER — Encounter: Payer: Self-pay | Admitting: General Practice

## 2017-04-13 DIAGNOSIS — M545 Low back pain: Secondary | ICD-10-CM | POA: Diagnosis not present

## 2017-04-13 NOTE — Progress Notes (Signed)
[place] Psychosocial Distress Screening Alma presented to [facility], including introduction of [place] team/resources, reviewing distress screen per protocol.  The patient scored a 0 on the Psychosocial Distress Thermometer which indicates minimal distress.   ONCBCN DISTRESS SCREENING 04/13/2017  Distress experienced in past week (1-10) 0  Referral to support programs Yes    Follow up needed: No. Per pt, no needs/concerns at this time. He has full packet of [place] team/programming resources, including calendar and contact info, should he desire further contact/support. Please also page if immediate needs arise. Thank you.   [place], [place], [facility] Pager [phone] Voicemail [phone]

## 2017-04-18 DIAGNOSIS — N393 Stress incontinence (female) (male): Secondary | ICD-10-CM | POA: Diagnosis not present

## 2017-04-18 DIAGNOSIS — M62838 Other muscle spasm: Secondary | ICD-10-CM | POA: Diagnosis not present

## 2017-04-18 DIAGNOSIS — M6281 Muscle weakness (generalized): Secondary | ICD-10-CM | POA: Diagnosis not present

## 2017-04-20 DIAGNOSIS — M545 Low back pain: Secondary | ICD-10-CM | POA: Diagnosis not present

## 2017-04-25 DIAGNOSIS — C61 Malignant neoplasm of prostate: Secondary | ICD-10-CM | POA: Diagnosis not present

## 2017-04-26 ENCOUNTER — Encounter: Payer: Self-pay | Admitting: Medical Oncology

## 2017-04-26 DIAGNOSIS — Z5111 Encounter for antineoplastic chemotherapy: Secondary | ICD-10-CM | POA: Diagnosis not present

## 2017-04-26 DIAGNOSIS — C61 Malignant neoplasm of prostate: Secondary | ICD-10-CM | POA: Diagnosis not present

## 2017-04-27 DIAGNOSIS — M545 Low back pain: Secondary | ICD-10-CM | POA: Diagnosis not present

## 2017-04-30 DIAGNOSIS — M545 Low back pain: Secondary | ICD-10-CM | POA: Diagnosis not present

## 2017-05-04 DIAGNOSIS — N393 Stress incontinence (female) (male): Secondary | ICD-10-CM | POA: Diagnosis not present

## 2017-05-04 DIAGNOSIS — M6281 Muscle weakness (generalized): Secondary | ICD-10-CM | POA: Diagnosis not present

## 2017-05-04 DIAGNOSIS — M62838 Other muscle spasm: Secondary | ICD-10-CM | POA: Diagnosis not present

## 2017-05-29 DIAGNOSIS — M6281 Muscle weakness (generalized): Secondary | ICD-10-CM | POA: Diagnosis not present

## 2017-05-29 DIAGNOSIS — M62838 Other muscle spasm: Secondary | ICD-10-CM | POA: Diagnosis not present

## 2017-05-29 DIAGNOSIS — N393 Stress incontinence (female) (male): Secondary | ICD-10-CM | POA: Diagnosis not present

## 2017-06-06 DIAGNOSIS — N5231 Erectile dysfunction following radical prostatectomy: Secondary | ICD-10-CM | POA: Diagnosis not present

## 2017-06-13 ENCOUNTER — Other Ambulatory Visit: Payer: Self-pay | Admitting: Urology

## 2017-06-13 NOTE — Patient Instructions (Addendum)
Charles [name]  06/13/2017   Your procedure is scheduled on: 07/03/2017    Report to [facility]  Entrance Take [place] Elevators to 3rd floor to  [place] at  [place] AM.    Call this number if you have problems the morning of surgery [phone]    Remember: ONLY 1 PERSON MAY GO WITH YOU TO SHORT STAY TO GET  READY MORNING OF YOUR SURGERY.  Do not eat food or drink liquids :After Midnight.     Take these medicines the morning of surgery with A SIP OF WATER: None                                You may not have any metal on your body including hair pins and              piercings  Do not wear jewelry,  lotions, powders or perfumes, deodorant                          Men may shave face and neck.   Do not bring valuables to the hospital. [place].  Contacts, dentures or bridgework may not be worn into surgery.  Leave suitcase in the car. After surgery it may be brought to your room.                   Please read over the following fact sheets you were given: _____________________________________________________________________             [facility] - Preparing for Surgery Before surgery, you can play an important role.  Because skin is not sterile, your skin needs to be as free of germs as possible.  You can reduce the number of germs on your skin by washing with CHG (chlorahexidine gluconate) soap before surgery.  CHG is an antiseptic cleaner which kills germs and bonds with the skin to continue killing germs even after washing. Please DO NOT use if you have an allergy to CHG or antibacterial soaps.  If your skin becomes reddened/irritated stop using the CHG and inform your nurse when you arrive at Short Stay. Do not shave (including legs and underarms) for at least 48 hours prior to the first CHG shower.  You may shave your face/neck. Please follow these instructions carefully:  1.  Shower  with CHG Soap the night before surgery and the  morning of Surgery.  2.  If you choose to wash your hair, wash your hair first as usual with your  normal  shampoo.  3.  After you shampoo, rinse your hair and body thoroughly to remove the  shampoo.                           4.  Use CHG as you would any other liquid soap.  You can apply chg directly  to the skin and wash                       Gently with a scrungie or clean washcloth.  5.  Apply the CHG Soap to your body ONLY FROM THE NECK DOWN.  Do not use on face/ open                           Wound or open sores. Avoid contact with eyes, ears mouth and genitals (private parts).                       Wash face,  Genitals (private parts) with your normal soap.             6.  Wash thoroughly, paying special attention to the area where your surgery  will be performed.  7.  Thoroughly rinse your body with warm water from the neck down.  8.  DO NOT shower/wash with your normal soap after using and rinsing off  the CHG Soap.                9.  Pat yourself dry with a clean towel.            10.  Wear clean pajamas.            11.  Place clean sheets on your bed the night of your first shower and do not  sleep with pets. Day of Surgery : Do not apply any lotions/deodorants the morning of surgery.  Please wear clean clothes to the hospital/surgery center.  FAILURE TO FOLLOW THESE INSTRUCTIONS MAY RESULT IN THE CANCELLATION OF YOUR SURGERY PATIENT SIGNATURE_________________________________  NURSE SIGNATURE__________________________________  ________________________________________________________________________

## 2017-06-14 ENCOUNTER — Encounter (HOSPITAL_COMMUNITY)
Admission: RE | Admit: 2017-06-14 | Discharge: 2017-06-14 | Disposition: A | Discharge status: Home / Self Care | Payer: BLUE CROSS/BLUE SHIELD | Source: Ambulatory Visit | Attending: Urology | Admitting: Urology

## 2017-06-14 ENCOUNTER — Encounter (INDEPENDENT_AMBULATORY_CARE_PROVIDER_SITE_OTHER): Payer: Self-pay

## 2017-06-14 ENCOUNTER — Encounter (HOSPITAL_COMMUNITY): Payer: Self-pay

## 2017-06-14 DIAGNOSIS — Z01818 Encounter for other preprocedural examination: Secondary | ICD-10-CM | POA: Insufficient documentation

## 2017-06-14 LAB — BASIC METABOLIC PANEL
Anion gap: 8 (ref 5–15)
BUN: 17 mg/dL (ref 6–20)
CHLORIDE: 103 mmol/L (ref 101–111)
CO2: 28 mmol/L (ref 22–32)
Calcium: 9.6 mg/dL (ref 8.9–10.3)
Creatinine, Ser: 0.98 mg/dL (ref 0.61–1.24)
GFR calc Af Amer: >60 mL/min (ref >60)
GFR calc non Af Amer: >60 mL/min (ref >60)
GLUCOSE: 97 mg/dL (ref 65–99)
POTASSIUM: 4.5 mmol/L (ref 3.5–5.1)
Sodium: 139 mmol/L (ref 135–145)

## 2017-06-14 LAB — CBC
HEMATOCRIT: 40.2 % (ref 39.0–52.0)
Hemoglobin: 14.3 g/dL (ref 13.0–17.0)
MCH: 29.4 pg (ref 26.0–34.0)
MCHC: 35.6 g/dL (ref 30.0–36.0)
MCV: 82.7 fL (ref 78.0–100.0)
Platelets: 207 10*3/uL (ref 150–400)
RBC: 4.86 MIL/uL (ref 4.22–5.81)
RDW: 12.7 % (ref 11.5–15.5)
WBC: 5.4 10*3/uL (ref 4.0–10.5)

## 2017-06-14 NOTE — Progress Notes (Signed)
12-28-16 ( EPIC) EKG, CXR

## 2017-07-02 MED ORDER — GENTAMICIN SULFATE 40 MG/ML IJ SOLN
440.0000 mg | INTRAVENOUS | Status: AC
  Administered 2017-07-03: 440 mg via INTRAVENOUS
  Filled 2017-07-02: qty 11

## 2017-07-02 NOTE — H&P (Signed)
HPI: Charles [name] is a 56 year-old male established patient who is here for erectile dysfunction.  He does have problems with erectile dysfunction. He noticed his ED 01/05/2017. His symptoms did not begin gradually. He does have difficulties achieving an erection. His libido has decreased.   His medication did not allow him to achieve satisfactory erections.   06/06/17: He underwent a non-nerve sparing radical prostatectomy and has developed erectile dysfunction. He gets no erections whatsoever. He has not tried any phosphodiesterase inhibitors. He is not interested in injection therapy. He is right-handed. He has scheduled to undergo radiation therapy. He has done extensive Theme park manager.     ALLERGIES: No Known Allergies    MEDICATIONS: Metoprolol Succinate  Omeprazole 40 mg capsule,delayed release  Losartan Potassium  Spironolactone     GU PSH: Complex Uroflow - 01/16/2017, 12/27/2016 Inject For cystogram - 01/16/2017 Laparoscopy; Lymphadenectomy - 01/05/2017 Prostate Needle Biopsy - 11/16/2016 Robotic Radical Prostatectomy - 01/05/2017 Vasectomy - 1995      PSH Notes: Vasectomy Reversal 2000   NON-GU PSH: Cholecystectomy (open) - 2015    GU PMH: Stress Incontinence - 05/29/2017, - 05/04/2017, - 04/18/2017, - 04/11/2017, - 03/22/2017, - 02/17/2017, - 02/03/2017, - 01/02/2017 ED due to arterial insufficiency - 02/17/2017 Prostate Cancer      PMH Notes:   1) Prostate cancer: He is s/p a NNS RAL radical prostatectomy and BPLND on 01/05/17.   Diagnosis: pT3b N0 Mx, Gleason 5+4=9 adenocarcinoma of the prostate with multiple positive margins  Pretreatment PSA: 12.7  Pretreatment IPSS: 35  Pretreatment SHIM:25   NON-GU PMH: Muscle weakness (generalized) - 05/29/2017, - 05/04/2017, - 04/18/2017, - 04/11/2017, - 03/22/2017, - 02/17/2017, - 02/03/2017, - 01/02/2017, - 12/28/2016 Other muscle spasm - 05/29/2017, - 05/04/2017, - 04/18/2017, - 04/11/2017, - 03/22/2017, - 02/17/2017, - 02/03/2017, -  01/02/2017 Cardiomyopathy, unspecified GERD    FAMILY HISTORY: GSW (gunshot wound) - Father    Notes: No family history of prostate cancer.   SOCIAL HISTORY: Marital Status: Married Current Smoking Status: Patient has never smoked.  Has never drank.  Drinks 4+ caffeinated drinks per day. Patient's occupation Estate manager/land agent.    REVIEW OF SYSTEMS:    GU Review Male:   Patient reports leakage of urine and erection problems. Patient denies frequent urination, hard to postpone urination, burning/ pain with urination, get up at night to urinate, stream starts and stops, trouble starting your stream, have to strain to urinate , and penile pain.  Gastrointestinal (Upper):   Patient denies nausea, vomiting, and indigestion/ heartburn.  Gastrointestinal (Lower):   Patient denies diarrhea and constipation.  Constitutional:   Patient denies fever, night sweats, weight loss, and fatigue.  Skin:   Patient denies skin rash/ lesion and itching.  Eyes:   Patient denies blurred vision and double vision.  Ears/ Nose/ Throat:   Patient denies sore throat and sinus problems.  Hematologic/Lymphatic:   Patient denies swollen glands and easy bruising.  Cardiovascular:   Patient denies leg swelling and chest pains.  Respiratory:   Patient denies cough and shortness of breath.  Endocrine:   Patient denies excessive thirst.  Musculoskeletal:   Patient denies joint pain and back pain.  Neurological:   Patient denies headaches and dizziness.  Psychologic:   Patient denies depression and anxiety.   VITAL SIGNS:    Weight 203 lb / 92.08 kg  Height 68 in / 172.72 cm  BP 117/72 mmHg  Pulse 86 /min  BMI 30.9 kg/m    GU PHYSICAL  EXAMINATION:    Scrotum: No lesions. No edema. No cysts. No warts.  Epididymides: Right: no spermatocele, no masses, no cysts, no tenderness, no induration, no enlargement. Left: no spermatocele, no masses, no cysts, no tenderness, no induration, no enlargement.   Testes: No tenderness, no swelling, no enlargement left testes. No tenderness, no swelling, no enlargement right testes. Normal location left testes. Normal location right testes. No mass, no cyst, no varicocele, no hydrocele left testes. No mass, no cyst, no varicocele, no hydrocele right testes.  Urethral Meatus: Normal size. No lesion, no wart, no discharge, no polyp. Normal location.  Penis: Circumcised, no warts, no cracks. No dorsal Peyronie's plaques, no left corporal Peyronie's plaques, no right corporal Peyronie's plaques, no scarring, no warts. No balanitis, no meatal stenosis.   GU PHYSICAL EXAMINATION:    Anus and Perineum: No hemorrhoids. No anal stenosis. No rectal fissure, no anal fissure. No edema, no dimple, no perineal tenderness, no anal tenderness.  Scrotum: No lesions. No edema. No cysts. No warts.  Epididymides: Right: no spermatocele, no masses, no cysts, no tenderness, no induration, no enlargement. Left: no spermatocele, no masses, no cysts, no tenderness, no induration, no enlargement.  Testes: No tenderness, no swelling, no enlargement left testes. No tenderness, no swelling, no enlargement right testes. Normal location left testes. Normal location right testes. No mass, no cyst, no varicocele, no hydrocele left testes. No mass, no cyst, no varicocele, no hydrocele right testes.  Urethral Meatus: Normal size. No lesion, no wart, no discharge, no polyp. Normal location.  Penis: Circumcised, no warts, no cracks. No dorsal Peyronie's plaques, no left corporal Peyronie's plaques, no right corporal Peyronie's plaques, no scarring, no warts. No balanitis, no meatal stenosis.   MULTI-SYSTEM PHYSICAL EXAMINATION:    Constitutional: Well-nourished. No physical deformities. Normally developed. Good grooming.  Neck: Neck symmetrical, not swollen. Normal tracheal position.  Respiratory: No labored breathing, no use of accessory muscles.   Cardiovascular: Normal temperature, normal  extremity pulses, no swelling, no varicosities.  Lymphatic: No enlargement of neck, axillae, groin.  Skin: No paleness, no jaundice, no cyanosis. No lesion, no ulcer, no rash.  Neurologic / Psychiatric: Oriented to time, oriented to place, oriented to person. No depression, no anxiety, no agitation.  Gastrointestinal: No mass, no tenderness, no rigidity, non obese abdomen.  Eyes: Normal conjunctivae. Normal eyelids.  Ears, Nose, Mouth, and Throat: Left ear no scars, no lesions, no masses. Right ear no scars, no lesions, no masses. Nose no scars, no lesions, no masses. Normal hearing. Normal lips.  Musculoskeletal: Normal gait and station of head and neck.       PAST DATA REVIEWED:  Source Of History:  Patient  Records Review:   Previous Patient Records, POC Tool   03/28/17 02/10/17  PSA  Total PSA 0.058 ng/dl 0.036 ng/dl    PROCEDURES: None   ASSESSMENT/PLAN:     ED following radical prostatectomy - We discussed the various etiologies of ED including neurogenic (small nerve damage, especially in diabetics), vascular disease, psychogenic, etc. We discussed the treatment ladder approach ranging from oral PDE5i meds, to vacuum erection devices, to intraurethral medication, to injection therapy, to prosthetics and their and presented them in order of increasing efficacy, but also increasing risk. We also discussed the role of androgen evaluation and replacement and that I typically reserve such work-ups for men with low-libido as significant issue. We concluded by mentioning the association of ED and serious cardiovascular disease (coronary atherosclerosis // CAD) with ED pre-dating CAD sometimes  by a period of years and the non-guideline recommendations for dedicate CV evaluation in patients with new ED.           Notes:   We discussed the procedure of inflatable penile prosthesis implantation in detail. I went over the incision that I would use in the placement of his reservoir. We discussed the  risks and complications including the need for complete removal of all of the components if infection should occur. I also went over the need for an overnight stay after the procedure as well as the anticipated postoperative course. I have answered all of his questions today to his satisfaction.   I told him I would like to perform his prosthesis surgery before he undergoes radiation in order to improve wound healing and reduce any potential risk including that of infection. He is in agreement with this.

## 2017-07-02 NOTE — Discharge Instructions (Signed)
Penile prosthesis postoperative instructions ° °Wound: ° °In most cases your incision will have absorbable sutures that will dissolve within the first 10-20 days. Some will fall out even earlier. Expect some redness as the sutures dissolved but this should occur only around the sutures. If there is generalized redness, especially with increasing pain or swelling, let us know. The scrotum and penis will very likely get "black and blue" as the blood in the tissues spread. Sometimes the whole scrotum will turn colors. The black and blue is followed by a yellow and brown color. In time, all the discoloration will go away. In some cases some firm swelling in the area of the testicle and pump may persist for up to 4-6 weeks after the surgery and is considered normal in most cases. ° °Diet: ° °You may return to your normal diet within 24 hours following your surgery. You may note some mild nausea and possibly vomiting the first 6-8 hours following surgery. This is usually due to the side effects of anesthesia, and will disappear quite soon. I would suggest clear liquids and a very light meal the first evening following your surgery. ° °Activity: ° °Your physical activity should be restricted the first 48 hours. During that time you should remain relatively inactive, moving about only when necessary. During the first 7-10 days following surgery he should avoid lifting any heavy objects (anything greater than 15 pounds), and avoid strenuous exercise. If you work, ask us specifically about your restrictions, both for work and home. We will write a note to your employer if needed. ° °You should plan to wear a tight pair of jockey shorts or an athletic supporter for the first 4-5 days, even to sleep. This will keep the scrotum immobilized to some degree and keep the swelling down.The position of your penis will determine what is most comfortable but I strongly urge you to keep the penis in the "up" position (toward your  head). ° °Ice packs should be placed on and off over the scrotum for the first 48 hours. Frozen peas or corn in a ZipLock bag can be frozen, used and re-frozen. Fifteen minutes on and 15 minutes off is a reasonable schedule. The ice is a good pain reliever and keeps the swelling down. ° °Hygiene: ° °You may shower 48 hours after your surgery. Tub bathing should be restricted until the seventh day. ° °Medication: ° °You will be sent home with some type of pain medication. In many cases you will be sent home with a narcotic pain pill (hydrocodone or oxycodone). If the pain is not too bad, you may take either Tylenol (acetaminophen) or Advil (ibuprofen) which contain no narcotic agents, and might be tolerated a little better, with fewer side effects. If the pain medication you are sent home with does not control the pain, you will have to let us know. Some narcotic pain medications cannot be given or refilled by a phone call to a pharmacy. ° °Problems you should report to us: ° °· Fever of 101.0 degrees Fahrenheit or greater. °· Moderate or severe swelling under the skin incision or involving the scrotum. °Drug reaction such as hives, a rash, nausea or vomiting. °

## 2017-07-03 ENCOUNTER — Encounter (HOSPITAL_COMMUNITY)
Admission: RE | Disposition: A | Discharge status: Home / Self Care | Payer: Self-pay | Source: Ambulatory Visit | Attending: Urology

## 2017-07-03 ENCOUNTER — Observation Stay (HOSPITAL_COMMUNITY)
Admission: RE | Admit: 2017-07-03 | Discharge: 2017-07-04 | Disposition: A | Discharge status: Home / Self Care | Payer: BLUE CROSS/BLUE SHIELD | Source: Ambulatory Visit | Attending: Urology | Admitting: Urology

## 2017-07-03 ENCOUNTER — Ambulatory Visit (HOSPITAL_COMMUNITY): Payer: BLUE CROSS/BLUE SHIELD | Admitting: Anesthesiology

## 2017-07-03 ENCOUNTER — Encounter (HOSPITAL_COMMUNITY): Payer: Self-pay | Admitting: *Deleted

## 2017-07-03 DIAGNOSIS — Z8546 Personal history of malignant neoplasm of prostate: Secondary | ICD-10-CM | POA: Diagnosis not present

## 2017-07-03 DIAGNOSIS — N5231 Erectile dysfunction following radical prostatectomy: Principal | ICD-10-CM | POA: Diagnosis present

## 2017-07-03 DIAGNOSIS — C61 Malignant neoplasm of prostate: Secondary | ICD-10-CM | POA: Diagnosis not present

## 2017-07-03 DIAGNOSIS — K7689 Other specified diseases of liver: Secondary | ICD-10-CM | POA: Diagnosis not present

## 2017-07-03 DIAGNOSIS — I429 Cardiomyopathy, unspecified: Secondary | ICD-10-CM | POA: Insufficient documentation

## 2017-07-03 DIAGNOSIS — Z79899 Other long term (current) drug therapy: Secondary | ICD-10-CM | POA: Insufficient documentation

## 2017-07-03 DIAGNOSIS — N529 Male erectile dysfunction, unspecified: Secondary | ICD-10-CM | POA: Diagnosis not present

## 2017-07-03 DIAGNOSIS — K219 Gastro-esophageal reflux disease without esophagitis: Secondary | ICD-10-CM | POA: Diagnosis not present

## 2017-07-03 HISTORY — PX: PENILE PROSTHESIS IMPLANT: SHX240

## 2017-07-03 SURGERY — INSERTION, PENILE PROSTHESIS, INFLATABLE
Anesthesia: General

## 2017-07-03 MED ORDER — HYDROMORPHONE HCL-NACL 0.5-0.9 MG/ML-% IV SOSY
PREFILLED_SYRINGE | INTRAVENOUS | Status: AC
  Administered 2017-07-03: 0.5 mg via INTRAVENOUS
  Filled 2017-07-03: qty 2

## 2017-07-03 MED ORDER — ONDANSETRON HCL 4 MG/2ML IJ SOLN
INTRAMUSCULAR | Status: AC
  Filled 2017-07-03: qty 2

## 2017-07-03 MED ORDER — KETOROLAC TROMETHAMINE 30 MG/ML IJ SOLN: 30.0000 mg | Freq: Once | INTRAMUSCULAR | Status: DC | PRN

## 2017-07-03 MED ORDER — ROCURONIUM BROMIDE 50 MG/5ML IV SOSY
PREFILLED_SYRINGE | INTRAVENOUS | Status: AC
  Filled 2017-07-03: qty 5

## 2017-07-03 MED ORDER — HYDROMORPHONE HCL-NACL 0.5-0.9 MG/ML-% IV SOSY
0.5000 mg | PREFILLED_SYRINGE | INTRAVENOUS | Status: DC | PRN
  Administered 2017-07-03 (×2): 1 mg via INTRAVENOUS
  Filled 2017-07-03 (×2): qty 2

## 2017-07-03 MED ORDER — SODIUM CHLORIDE 0.9 % IV SOLN
INTRAVENOUS | Status: DC
  Administered 2017-07-03 (×2): via INTRAVENOUS

## 2017-07-03 MED ORDER — FENTANYL CITRATE (PF) 100 MCG/2ML IJ SOLN
INTRAMUSCULAR | Status: DC | PRN
  Administered 2017-07-03: 50 ug via INTRAVENOUS
  Administered 2017-07-03 (×2): 25 ug via INTRAVENOUS
  Administered 2017-07-03 (×3): 50 ug via INTRAVENOUS

## 2017-07-03 MED ORDER — OXYCODONE HCL 10 MG PO TABS: 10.0000 mg | ORAL_TABLET | ORAL | 0 refills | Status: DC | PRN

## 2017-07-03 MED ORDER — STERILE WATER FOR IRRIGATION IR SOLN
Status: DC | PRN
  Administered 2017-07-03: 500 mL

## 2017-07-03 MED ORDER — MIDAZOLAM HCL 2 MG/2ML IJ SOLN
INTRAMUSCULAR | Status: AC
  Filled 2017-07-03: qty 2

## 2017-07-03 MED ORDER — OXYCODONE HCL 5 MG PO TABS: 5.0000 mg | ORAL_TABLET | Freq: Once | ORAL | Status: DC | PRN

## 2017-07-03 MED ORDER — BUPIVACAINE-EPINEPHRINE (PF) 0.5% -1:200000 IJ SOLN
INTRAMUSCULAR | Status: DC | PRN
  Administered 2017-07-03: 15 mL

## 2017-07-03 MED ORDER — OXYCODONE HCL 5 MG PO TABS
5.0000 mg | ORAL_TABLET | ORAL | Status: DC | PRN
  Administered 2017-07-03 – 2017-07-04 (×3): 5 mg via ORAL
  Filled 2017-07-03 (×5): qty 1

## 2017-07-03 MED ORDER — KETAMINE HCL 10 MG/ML IJ SOLN
INTRAMUSCULAR | Status: DC | PRN
  Administered 2017-07-03: 40 mg via INTRAVENOUS
  Administered 2017-07-03: 10 mg via INTRAVENOUS

## 2017-07-03 MED ORDER — ACETAMINOPHEN 325 MG PO TABS: 650.0000 mg | ORAL_TABLET | ORAL | Status: DC | PRN

## 2017-07-03 MED ORDER — DEXAMETHASONE SODIUM PHOSPHATE 10 MG/ML IJ SOLN
INTRAMUSCULAR | Status: AC
  Filled 2017-07-03: qty 1

## 2017-07-03 MED ORDER — LIDOCAINE 2% (20 MG/ML) 5 ML SYRINGE
INTRAMUSCULAR | Status: AC
  Filled 2017-07-03: qty 5

## 2017-07-03 MED ORDER — BACITRACIN ZINC 500 UNIT/GM EX OINT
TOPICAL_OINTMENT | CUTANEOUS | Status: AC
  Filled 2017-07-03: qty 28.35

## 2017-07-03 MED ORDER — SUGAMMADEX SODIUM 500 MG/5ML IV SOLN
INTRAVENOUS | Status: AC
  Filled 2017-07-03: qty 5

## 2017-07-03 MED ORDER — SUCCINYLCHOLINE CHLORIDE 200 MG/10ML IV SOSY
PREFILLED_SYRINGE | INTRAVENOUS | Status: AC
  Filled 2017-07-03: qty 10

## 2017-07-03 MED ORDER — EPHEDRINE 5 MG/ML INJ
INTRAVENOUS | Status: AC
  Filled 2017-07-03: qty 10

## 2017-07-03 MED ORDER — MIDAZOLAM HCL 5 MG/5ML IJ SOLN
INTRAMUSCULAR | Status: DC | PRN
  Administered 2017-07-03: 2 mg via INTRAVENOUS

## 2017-07-03 MED ORDER — FENTANYL CITRATE (PF) 100 MCG/2ML IJ SOLN
INTRAMUSCULAR | Status: AC
  Filled 2017-07-03: qty 2

## 2017-07-03 MED ORDER — KETAMINE HCL 10 MG/ML IJ SOLN
INTRAMUSCULAR | Status: AC
  Filled 2017-07-03: qty 1

## 2017-07-03 MED ORDER — POLYMYXIN B SULFATE 500000 UNITS IJ SOLR
INTRAMUSCULAR | Status: DC | PRN
  Administered 2017-07-03: 1000 mL

## 2017-07-03 MED ORDER — SODIUM CHLORIDE 0.9 % IV SOLN
INTRAVENOUS | Status: AC
  Filled 2017-07-03 (×2): qty 500000

## 2017-07-03 MED ORDER — LACTATED RINGERS IV SOLN
INTRAVENOUS | Status: DC | PRN
  Administered 2017-07-03 (×2): via INTRAVENOUS

## 2017-07-03 MED ORDER — PROPOFOL 10 MG/ML IV BOLUS
INTRAVENOUS | Status: AC
  Filled 2017-07-03: qty 40

## 2017-07-03 MED ORDER — ZOLPIDEM TARTRATE 5 MG PO TABS: 5.0000 mg | ORAL_TABLET | Freq: Every evening | ORAL | Status: DC | PRN

## 2017-07-03 MED ORDER — OXYCODONE HCL 5 MG/5ML PO SOLN
5.0000 mg | Freq: Once | ORAL | Status: DC | PRN
  Filled 2017-07-03: qty 5

## 2017-07-03 MED ORDER — BUPIVACAINE-EPINEPHRINE (PF) 0.5% -1:200000 IJ SOLN
INTRAMUSCULAR | Status: AC
  Filled 2017-07-03: qty 30

## 2017-07-03 MED ORDER — EPHEDRINE SULFATE-NACL 50-0.9 MG/10ML-% IV SOSY
PREFILLED_SYRINGE | INTRAVENOUS | Status: DC | PRN
  Administered 2017-07-03: 10 mg via INTRAVENOUS
  Administered 2017-07-03 (×3): 5 mg via INTRAVENOUS

## 2017-07-03 MED ORDER — CEFAZOLIN SODIUM-DEXTROSE 2-4 GM/100ML-% IV SOLN
2.0000 g | INTRAVENOUS | Status: AC
  Administered 2017-07-03: 2 g via INTRAVENOUS

## 2017-07-03 MED ORDER — LIDOCAINE 2% (20 MG/ML) 5 ML SYRINGE
INTRAMUSCULAR | Status: DC | PRN
  Administered 2017-07-03: 100 mg via INTRAVENOUS

## 2017-07-03 MED ORDER — DEXAMETHASONE SODIUM PHOSPHATE 10 MG/ML IJ SOLN
INTRAMUSCULAR | Status: DC | PRN
  Administered 2017-07-03: 10 mg via INTRAVENOUS

## 2017-07-03 MED ORDER — ONDANSETRON HCL 4 MG/2ML IJ SOLN: 4.0000 mg | INTRAMUSCULAR | Status: DC | PRN

## 2017-07-03 MED ORDER — CEFAZOLIN SODIUM-DEXTROSE 2-4 GM/100ML-% IV SOLN
2.0000 g | Freq: Three times a day (TID) | INTRAVENOUS | Status: DC
  Administered 2017-07-03 – 2017-07-04 (×3): 2 g via INTRAVENOUS
  Filled 2017-07-03 (×9): qty 100

## 2017-07-03 MED ORDER — CEFAZOLIN SODIUM-DEXTROSE 2-4 GM/100ML-% IV SOLN
INTRAVENOUS | Status: AC
  Filled 2017-07-03: qty 100

## 2017-07-03 MED ORDER — ONDANSETRON HCL 4 MG/2ML IJ SOLN
INTRAMUSCULAR | Status: DC | PRN
  Administered 2017-07-03: 4 mg via INTRAVENOUS

## 2017-07-03 MED ORDER — HYDROMORPHONE HCL-NACL 0.5-0.9 MG/ML-% IV SOSY
0.2500 mg | PREFILLED_SYRINGE | INTRAVENOUS | Status: DC | PRN
  Administered 2017-07-03 (×4): 0.5 mg via INTRAVENOUS

## 2017-07-03 MED ORDER — 0.9 % SODIUM CHLORIDE (POUR BTL) OPTIME
TOPICAL | Status: DC | PRN
  Administered 2017-07-03: 1000 mL

## 2017-07-03 MED ORDER — AMOXICILLIN-POT CLAVULANATE 875-125 MG PO TABS: 1.0000 | ORAL_TABLET | Freq: Two times a day (BID) | ORAL | 0 refills | Status: AC

## 2017-07-03 MED ORDER — PROPOFOL 10 MG/ML IV BOLUS
INTRAVENOUS | Status: DC | PRN
  Administered 2017-07-03: 170 mg via INTRAVENOUS

## 2017-07-03 MED ORDER — PROMETHAZINE HCL 25 MG/ML IJ SOLN: 6.2500 mg | INTRAMUSCULAR | Status: DC | PRN

## 2017-07-03 SURGICAL SUPPLY — 45 items
APL SKNCLS STERI-STRIP NONHPOA (GAUZE/BANDAGES/DRESSINGS) ×1
BAG URINE DRAINAGE (UROLOGICAL SUPPLIES) ×3 IMPLANT
BANDAGE COBAN STERILE 2 (GAUZE/BANDAGES/DRESSINGS) ×1 IMPLANT
BENZOIN TINCTURE PRP APPL 2/3 (GAUZE/BANDAGES/DRESSINGS) ×2 IMPLANT
BLADE HEX COATED 2.75 (ELECTRODE) ×2 IMPLANT
BLADE SURG 15 STRL LF DISP TIS (BLADE) ×2 IMPLANT
BLADE SURG 15 STRL SS (BLADE) ×4
BNDG GAUZE ELAST 4 BULKY (GAUZE/BANDAGES/DRESSINGS) ×1 IMPLANT
CATH FOLEY 2WAY SLVR  5CC 16FR (CATHETERS) ×1
CATH FOLEY 2WAY SLVR 5CC 16FR (CATHETERS) ×1 IMPLANT
CHLORAPREP W/TINT 26ML (MISCELLANEOUS) IMPLANT
COVER MAYO STAND STRL (DRAPES) ×2 IMPLANT
COVER SURGICAL LIGHT HANDLE (MISCELLANEOUS) ×2 IMPLANT
DISSECTOR ROUND CHERRY 3/8 STR (MISCELLANEOUS) ×2 IMPLANT
DRAPE EXTREMITY T 121X128X90 (DRAPE) ×2 IMPLANT
DRAPE INCISE IOBAN 66X45 STRL (DRAPES) IMPLANT
DRSG TEGADERM 4X4.75 (GAUZE/BANDAGES/DRESSINGS) ×2 IMPLANT
ELECT REM PT RETURN 15FT ADLT (MISCELLANEOUS) ×2 IMPLANT
GAUZE PETROLATUM 1 X8 (GAUZE/BANDAGES/DRESSINGS) ×1 IMPLANT
GAUZE SPONGE 4X4 12PLY STRL (GAUZE/BANDAGES/DRESSINGS) ×1 IMPLANT
GLOVE BIOGEL M 8.0 STRL (GLOVE) ×4 IMPLANT
GOWN STRL REUS W/TWL XL LVL3 (GOWN DISPOSABLE) ×2 IMPLANT
KIT BASIN OR (CUSTOM PROCEDURE TRAY) ×2 IMPLANT
KIT TITAN ASSEMBLY (Erectile Restoration) ×1 IMPLANT
KIT TITAN ASSEMBLY STANDARD (Erectile Restoration) ×1 IMPLANT
KIT TITAN ASSEMBLY STD (Erectile Restoration) IMPLANT
NEEDLE HYPO 22GX1.5 SAFETY (NEEDLE) ×2 IMPLANT
PACK GENERAL/GYN (CUSTOM PROCEDURE TRAY) ×2 IMPLANT
PLUG CATH AND CAP STER (CATHETERS) ×2 IMPLANT
PROS TITAN SCROT 0 ANG 20CM (Erectile Restoration) ×2 IMPLANT
PROSTHESIS TTN SCRO 0 ANG 20CM (Erectile Restoration) IMPLANT
RESERVOIR TITAN 12.5CC W/VLV ×1 IMPLANT
RETRACTOR WILSON SYSTEM (INSTRUMENTS) ×2 IMPLANT
SCRUB TECHNI CARE 4 OZ NO DYE (MISCELLANEOUS) IMPLANT
SPONGE LAP 4X18 X RAY DECT (DISPOSABLE) IMPLANT
STRIP CLOSURE SKIN 1/2X4 (GAUZE/BANDAGES/DRESSINGS) ×2 IMPLANT
SUT CHROMIC 3 0 SH 27 (SUTURE) ×5 IMPLANT
SUT MNCRL AB 4-0 PS2 18 (SUTURE) ×2 IMPLANT
SUT PDS AB 2-0 CT2 27 (SUTURE) ×8 IMPLANT
SUT VIC AB 2-0 UR6 27 (SUTURE) ×4 IMPLANT
SYR 10ML LL (SYRINGE) ×1 IMPLANT
SYR 20CC LL (SYRINGE) ×2 IMPLANT
SYR 50ML LL SCALE MARK (SYRINGE) ×4 IMPLANT
SYR CONTROL 10ML LL (SYRINGE) ×2 IMPLANT
TOWEL OR 17X26 10 PK STRL BLUE (TOWEL DISPOSABLE) ×4 IMPLANT

## 2017-07-03 NOTE — Transfer of Care (Signed)
Immediate Anesthesia Transfer of Care Note  Patient: Charles [name]  Procedure(s) Performed: Procedure(s) with comments: PENILE PROTHESIS INFLATABLE/ 3 PC/ SCROTAL (N/A) - Needs RNFA  Patient Location: PACU  Anesthesia Type:General  Level of Consciousness:  sedated, patient cooperative and responds to stimulation  Airway & Oxygen Therapy:Patient Spontanous Breathing and Patient connected to face mask oxgen  Post-op Assessment:  Report given to PACU RN and Post -op Vital signs reviewed and stable  Post vital signs:  Reviewed and stable  Last Vitals:  Vitals:   07/03/17 0540  BP: 114/75  Pulse: 75  Resp: 16  Temp: 35.3 C    Complications: No apparent anesthesia complications

## 2017-07-03 NOTE — Anesthesia Postprocedure Evaluation (Signed)
Anesthesia Post Note  Patient: Charles [name]  Procedure(s) Performed: Procedure(s) (LRB): PENILE PROTHESIS INFLATABLE/ 3 PC/ SCROTAL (N/A)     Patient location during evaluation: PACU Anesthesia Type: General Level of consciousness: awake and alert Pain management: pain level controlled Vital Signs Assessment: post-procedure vital signs reviewed and stable Respiratory status: spontaneous breathing, nonlabored ventilation, respiratory function stable and patient connected to nasal cannula oxygen Cardiovascular status: blood pressure returned to baseline and stable Postop Assessment: no signs of nausea or vomiting Anesthetic complications: no    Last Vitals:  Vitals:   07/03/17 0930 07/03/17 0945  BP: 125/87 126/87  Pulse: 95 96  Resp: 13 16  Temp:      Last Pain:  Vitals:   07/03/17 0945  TempSrc:   PainSc: 6                  Jennette Leask S

## 2017-07-03 NOTE — Op Note (Signed)
PATIENT:  Charles [name]  PRE-OPERATIVE DIAGNOSIS:  Erectile dysfunction secondary to radical prostatectomy  POST-OPERATIVE DIAGNOSIS:  Same  PROCEDURE:  Procedure(s): 3 piece inflatable in all prosthesis (Coloplast)  SURGEON:  Claybon Jabs  INDICATION: He has had long-standing organic erectile dysfunction and has been treated with, unsuccessfully, with oral agents, vacuum erection device and intracavernosal injection therapy without success. He has elected to proceed with prosthesis implantation.  ANESTHESIA:  General  EBL:  Minimal  Device: 3 piece Titan: 125 cc reservoir, 20 cm cylinders and 2 cm rear-tip extenders on right and left sides  LOCAL MEDICATIONS USED:  15 mL of half percent Marcaine with epinephrine  SPECIMEN: None  DISPOSITION OF SPECIMEN:  N/A  Description of procedure: The patient was taken to the major operating room, placed on the table and administered general anesthesia in the supine position. His genitalia was then scrubbed for 10 minutes, painted and then sterilely draped. An official timeout was then performed.  A 16 French Foley catheter was placed in the bladder and the bladder was drained and the catheter was plugged. A midline median raphae scrotal incision was then made and then blunt dissection was used to expose the corpus cavernosum on the right and left sides. This was further cleared of overlying tissue using sharp technique. 2-0 PDS sutures were then placed in the corpus cavernosum to service stay sutures and then an incision was then made in the corpus cavernosum first on the left-hand side with the knife and then extended proximally and distally with the curved Mayo scissors. I then dilated the corpus cavernosum with the Hegar dilators starting at 10 and progressing to 13 proximally and distally. I then irrigated the corpus cavernosum with antibiotic solution and measured the distance proximally and distally from the stay suture and was found to be  11 cm. I then turned my attention to the contralateral corpus cavernosum and placed my stay sutures, made my corporotomy and dilated the corpus cavernosum in an identical fashion. This was measured and also was found to be 11 cm. It was irrigated with antibiotic solution as was the scrotum. I then chose an 20 cm cylinder set with 2 cm rear-tip extenders and these were prepped while I prepared the site for reservoir placement.  I used blunt technique to dissect up to the external inguinal ring on the right-hand side. I swept the spermatic cord laterally and then was able to bluntly dissect with my index finger through the anterior wall of the inguinal ring. I drained the bladder completely with a Foley catheter and then developed a space beneath the rectus muscle for the reservoir. I irrigated the space with antibiotic solution and then placed the reservoir in this location. I then filled the reservoir with 85 cc of sterile saline.  Attention was redirected to the corporotomies where the cylinders were then placed by first fixing the suture to the distal aspect of the right cylinder to a straight needle. This was then loaded on the [facility] inserter and passed through the corporotomy and distally. I then advanced the straight needle with the Furlow inserter out through the glans and this was grasped with a hemostat and pulled through the glans and the suture was secured with a hemostat. I then performed an identical maneuver on the contralateral side. After this was performed I irrigated both corpus cavernosum and inserted the distal portion of the cylinder through the corporotomies and pulled this to the end of the corpora with the suture. The  proximal aspect with the rear-tip extender was then passed through the corporotomy and into the seated position on each side. I then connected reservoir tubing to a syringe filled with sterile saline and inflated the device. I noted a good straight erection with both  cylinders equidistant under the glans and no buckling of the cylinders. I therefore deflated the device and closed the corporotomies with running 2-0 PDS suture. The cylinder was then connected to the pump after excising the excess tubing with appropriate shodded hemostats in place and then I used the supplied connectors to make the connection. I then again cycled the device with the pump and it cycled properly. I deflated the device and pumped it up about three quarters of the way to aid with hemostasis. I irrigated the scrotum once again with antibiotic solution.  I then developed the scrotal pocket in the right hemiscrotum in place the pump in this location. I irrigated the wound one last time with antibiotic irrigation and then closed the deep scrotal tissue over the tubing and pump with running 3-0 chromic suture. A second layer was then closed over this first layer with running 3-0 chromic and then the skin was closed with running 3-0 chromic. I injected the local anesthetic in the subcutaneous tissue prior to closing the skin. Antibiotic ointment was then applied to the scrotum. I then wrapped the scrotum and penis with a Curlex. The catheter was connected to closed system drainage and the patient was awakened and taken recovery room in stable and satisfactory condition. He tolerated the procedure well and there were no intraoperative complications. Needle sponge and instrument counts were correct at the end of the operation.  PLAN OF CARE: He will be observed overnight with intravenous antibiotics and pain medication with plan for discharge in the a.m.   PATIENT DISPOSITION:  PACU - hemodynamically stable.

## 2017-07-03 NOTE — Anesthesia Preprocedure Evaluation (Signed)
Anesthesia Evaluation  Patient identified by MRN, date of birth, ID band Patient awake    Reviewed: Allergy & Precautions, NPO status , Patient's Chart, lab work & pertinent test results  Airway Mallampati: II  TM Distance: >3 FB Neck ROM: Full    Dental no notable dental hx.    Pulmonary neg pulmonary ROS,    Pulmonary exam normal breath sounds clear to auscultation       Cardiovascular +CHF  Normal cardiovascular exam Rhythm:Regular Rate:Normal     Neuro/Psych negative neurological ROS  negative psych ROS   GI/Hepatic negative GI ROS, Neg liver ROS,   Endo/Other  negative endocrine ROS  Renal/GU negative Renal ROS  negative genitourinary   Musculoskeletal negative musculoskeletal ROS (+)   Abdominal   Peds negative pediatric ROS (+)  Hematology negative hematology ROS (+)   Anesthesia Other Findings   Reproductive/Obstetrics negative OB ROS                             Anesthesia Physical Anesthesia Plan  ASA: II  Anesthesia Plan: General   Post-op Pain Management:    Induction: Intravenous  PONV Risk Score and Plan: 1 and Ondansetron and Dexamethasone  Airway Management Planned: LMA and Oral ETT  Additional Equipment:   Intra-op Plan:   Post-operative Plan: Extubation in OR  Informed Consent: I have reviewed the patients History and Physical, chart, labs and discussed the procedure including the risks, benefits and alternatives for the proposed anesthesia with the patient or authorized representative who has indicated his/her understanding and acceptance.   Dental advisory given  Plan Discussed with: CRNA and Surgeon  Anesthesia Plan Comments:         Anesthesia Quick Evaluation

## 2017-07-04 DIAGNOSIS — Z8546 Personal history of malignant neoplasm of prostate: Secondary | ICD-10-CM | POA: Diagnosis not present

## 2017-07-04 DIAGNOSIS — K219 Gastro-esophageal reflux disease without esophagitis: Secondary | ICD-10-CM | POA: Diagnosis not present

## 2017-07-04 DIAGNOSIS — I429 Cardiomyopathy, unspecified: Secondary | ICD-10-CM | POA: Diagnosis not present

## 2017-07-04 DIAGNOSIS — N5231 Erectile dysfunction following radical prostatectomy: Secondary | ICD-10-CM | POA: Diagnosis not present

## 2017-07-04 DIAGNOSIS — Z79899 Other long term (current) drug therapy: Secondary | ICD-10-CM | POA: Diagnosis not present

## 2017-07-04 LAB — HIV ANTIBODY (ROUTINE TESTING W REFLEX): HIV Screen 4th Generation wRfx: NONREACTIVE

## 2017-07-04 NOTE — Care Management Note (Signed)
Case Management Note  Patient Details  Name: AKONI PARTON MRN: [id number] Date of Birth: 1960-12-21  Subjective/Objective:  56 y/o m admitted w/Erectile dysfunction. From home.                  Action/Plan:d/c home.   Expected Discharge Date:                Expected Discharge Plan:  Home/Self Care  In-House Referral:     Discharge planning Services  CM Consult  Post Acute Care Choice:    Choice offered to:     DME Arranged:    DME Agency:     HH Arranged:    [place] Agency:     Status of Service:  Completed, signed off  If discussed at [employer] of Stay Meetings, dates discussed:    Additional Comments:  Dessa Phi, RN 07/04/2017, 8:58 AM

## 2017-07-04 NOTE — Discharge Summary (Signed)
Physician Discharge Summary  Patient ID: Charles [name] MRN: [id number] DOB/AGE: [birth year] 56 y.o.  Admit date: 07/03/2017 Discharge date: 07/04/2017  Admission Diagnoses:Erectile dysfunction secondary to radical prostatectomy  Discharge Diagnoses:  Active Problems:   Erectile dysfunction after radical prostatectomy   Discharged Condition: good  Hospital Course: He electively underwent a 3 piece penile prosthesis implantation without complication. He was observed overnight and received intravenous antibiotics and pain medication. By the following morning he was doing well. No complaints of pain. He is therefore felt ready for discharge.   Discharge Exam: Blood pressure 101/62, pulse 82, temperature 97.8 F (36.6 C), temperature source Oral, resp. rate 14, height 5\' 8"  (1.727 m), weight 97.2 kg (214 lb 4.6 oz), SpO2 98 %. General appearance: alert, cooperative and no distress  Abdomen is soft and nontender. Genitalia are in his mummy wrap dressing with no discharge. I note some ecchymoses at the base of the penis. The glans appears normal.  Disposition: 01-Home or Self Care  Discharge Instructions    Discharge patient    Complete by:  As directed    Discharge disposition:  01-Home or Self Care   Discharge patient date:  07/04/2017     Allergies as of 07/04/2017      Reactions   Dairy Aid [lactase] Other (See Comments)   Creates acid reflex and weakness       Medication List    TAKE these medications   amoxicillin-clavulanate 875-125 MG tablet Commonly known as:  AUGMENTIN Take 1 tablet by mouth 2 (two) times daily.   co-enzyme Q-10 50 MG capsule Take 240 mg by mouth daily.   MAGNESIUM CITRATE PO Take 2 tablets by mouth 2 (two) times daily.   NIACINAMIDE PO Take 2 tablets by mouth 2 (two) times daily.   Oxycodone HCl 10 MG Tabs Take 1 tablet (10 mg total) by mouth every 4 (four) hours as needed.   vitamin C 100 MG tablet Take 250 mg by mouth daily. Takes 2  daily      Follow-up Information    [facility], [facility] On 07/10/2017.   Why:  For your appiontment at 9:30 Contact information: [place] [address] [phone]           Signed: Claybon Jabs 07/04/2017, 6:44 AM

## 2017-07-07 ENCOUNTER — Encounter: Payer: Self-pay | Admitting: Urology

## 2017-07-07 NOTE — Progress Notes (Signed)
Patient had recent placement of penile prosthesis on 07/04/17 and will need at least 3 weeks healing time prior to proceeding with salvage XRT.  I will plan to touch base with Dr. Alinda Money in 2-3 weeks to assess readiness to proceed with salvage XRT and begin scheduling accordingly if appropriate.  Patient will need a FUN appointment prior to [facility] since we have not seen him since 03/2017.

## 2017-07-10 DIAGNOSIS — C61 Malignant neoplasm of prostate: Secondary | ICD-10-CM | POA: Diagnosis not present

## 2017-07-10 DIAGNOSIS — N5231 Erectile dysfunction following radical prostatectomy: Secondary | ICD-10-CM | POA: Diagnosis not present

## 2017-07-12 DIAGNOSIS — M62838 Other muscle spasm: Secondary | ICD-10-CM | POA: Diagnosis not present

## 2017-07-12 DIAGNOSIS — M6281 Muscle weakness (generalized): Secondary | ICD-10-CM | POA: Diagnosis not present

## 2017-07-12 DIAGNOSIS — N393 Stress incontinence (female) (male): Secondary | ICD-10-CM | POA: Diagnosis not present

## 2017-07-17 DIAGNOSIS — J029 Acute pharyngitis, unspecified: Secondary | ICD-10-CM | POA: Diagnosis not present

## 2017-07-17 DIAGNOSIS — K219 Gastro-esophageal reflux disease without esophagitis: Secondary | ICD-10-CM | POA: Diagnosis not present

## 2017-08-14 DIAGNOSIS — I429 Cardiomyopathy, unspecified: Secondary | ICD-10-CM | POA: Diagnosis not present

## 2017-08-14 DIAGNOSIS — I428 Other cardiomyopathies: Secondary | ICD-10-CM | POA: Diagnosis not present

## 2017-08-14 DIAGNOSIS — E785 Hyperlipidemia, unspecified: Secondary | ICD-10-CM | POA: Diagnosis not present

## 2017-08-15 ENCOUNTER — Encounter: Payer: Self-pay | Admitting: Radiation Oncology

## 2017-08-15 DIAGNOSIS — N5231 Erectile dysfunction following radical prostatectomy: Secondary | ICD-10-CM | POA: Diagnosis not present

## 2017-08-23 NOTE — Progress Notes (Signed)
GU Location of Tumor / Histology: prostatic adenocarcinoma     If Prostate Cancer, Gleason Score is (5 + 4) and PSA is (12.7) pretreatment.    Charles [name] had a penile prothesis scrotal inflation 07-03-17 and  radical prostatectomy on 01/05/17.  FUN to assess his progress with bladder control and readiness to proceed with EBRT.    Past/Anticipated interventions by urology, if any:07-03-17 Penile prothesis scrotal inflatable  01-05-17 Prostatectomy, referral to [facility]  Past/Anticipated interventions by medical oncology, if any: no  Weight changes, if any: No Wt Readings from Last 3 Encounters:  08/24/17 216 lb 6.4 oz (98.2 kg)  07/03/17 214 lb 4.6 oz (97.2 kg)  06/14/17 212 lb 6 oz (96.3 kg)    Bowel/Bladder complaints, if any:08-24-17 Still wearing a pad  mostly for protective purposes if he is out for an event. Discomfort toward base of penis relieved with urination relieved.   Nausea/Vomiting, if any: No  Pain issues, if any: No   SAFETY ISSUES:  Prior radiation? no  Pacemaker/ICD? No  Possible current pregnancy? No  Is the patient on methotrexate? 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                         BP 118/65   Pulse 74   Temp 98.2 F (36.8 C) (Oral)   Resp 18   Ht 5\' 8"  (1.727 m)   Wt 216 lb 6.4 oz (98.2 kg)   SpO2 100%   BMI 32.90 kg/m

## 2017-08-24 ENCOUNTER — Ambulatory Visit
Admission: RE | Admit: 2017-08-24 | Discharge: 2017-08-24 | Disposition: A | Discharge status: Home / Self Care | Payer: BLUE CROSS/BLUE SHIELD | Source: Ambulatory Visit | Attending: Radiation Oncology | Admitting: Radiation Oncology

## 2017-08-24 ENCOUNTER — Ambulatory Visit
Admission: RE | Admit: 2017-08-24 | Discharge: 2017-08-24 | Disposition: A | Discharge status: Home / Self Care | Payer: BLUE CROSS/BLUE SHIELD | Source: Ambulatory Visit | Attending: Urology | Admitting: Urology

## 2017-08-24 ENCOUNTER — Encounter: Payer: Self-pay | Admitting: Urology

## 2017-08-24 VITALS — BP 118/65 | HR 74 | Temp 98.2°F | Resp 18 | Ht 68.0 in | Wt 216.4 lb

## 2017-08-24 DIAGNOSIS — R9721 Rising PSA following treatment for malignant neoplasm of prostate: Secondary | ICD-10-CM | POA: Diagnosis not present

## 2017-08-24 DIAGNOSIS — Z9079 Acquired absence of other genital organ(s): Secondary | ICD-10-CM | POA: Diagnosis not present

## 2017-08-24 DIAGNOSIS — C61 Malignant neoplasm of prostate: Secondary | ICD-10-CM | POA: Diagnosis not present

## 2017-08-24 DIAGNOSIS — Z510 Encounter for antineoplastic radiation therapy: Secondary | ICD-10-CM | POA: Insufficient documentation

## 2017-08-24 DIAGNOSIS — Z9289 Personal history of other medical treatment: Secondary | ICD-10-CM | POA: Diagnosis not present

## 2017-08-24 NOTE — Progress Notes (Signed)
Radiation Oncology         [phone] ________________________________   Follow up Visit  Name: Charles [name] MRN: [id number]  Date: 08/24/2017  DOB: 1961-05-02  YD:XAJOINOMVEH, Ronie Spies, MD  Kathie Rhodes, MD   REFERRING PHYSICIAN: Kathie Rhodes, MD  DIAGNOSIS: 56 y.o. gentleman with stage pT3b N0 Mx adenocarcinoma of the prostate with a Gleason's score of 5+5 and a pre-op PSA of 12.7.  He is s/p prostatectomy with pathology indicating multiple positive margins, extensive extraprostatic extension with involvment of bilateral SVs and lymphovascular invasion.    ICD-10-CM   1. Prostate cancer ([place]) C61     HISTORY OF PRESENT ILLNESS::Charles [name] is a 56 y.o. gentleman with a high risk, stage pT3b N0 Mx adenocarcinoma of the prostate with a Gleason's score of 5+5 and a pre-op PSA of 12.7.  Surgical pathology following radiacal prostatectomy with BPLND on 01/05/17 showed extensive extraprostatic extension at the apex, mid and base bilaterally, bilateral seminal vesicles and lymphovascular invasion.  Additionally, there were multiple positive margins with Gleason 5+5 at bilateral apex, right posterior mid, bilateral posterior base and right anterior near bladder neck.  12/12 lymph nodes were negative for malignancy.  PSA 02/10/17 was 0.036 and most recently, on 03/28/17 was 0.058. Dr. Tammi Klippel and I initially met him at prostate multidisciplinary clinic in 03/2017.  At that time, he was interested in proceeding with adjuvant radiotherapy but needed some additional time for further recovery of his continence.     Pretreatment disease staging with CT A/P 11/24/17 showed a 81m preaortic lymph node but otherwise negative for metastatic disease. Bone scan 11/24/17 was negative for metastatic disease as well.  Since we saw the patient last, he has continued working with PT and has made significant improvements in his bladder control, now only using a pad on rare occasion if he is going to be in  a situation where he is away from a restroom for an extended period of time.  He has also had placement of a penile prosthesis for ED by Dr. OKarsten Roon 07/03/17 and has fully recovered from this procedure at this time.  The patient presents today for further discussion of potential adjuvant radiation treatment options and clinical evaluation.    PREVIOUS RADIATION THERAPY: No  PAST MEDICAL HISTORY:  has a past medical history of GERD (gastroesophageal reflux disease); Medical history non-contributory; Nonischemic cardiomyopathy ([place] (12/10/2015); and Prostate cancer ([place].    PAST SURGICAL HISTORY: Past Surgical History:  Procedure Laterality Date  . CHOLECYSTECTOMY N/A 05/22/2014   Procedure: LAPAROSCOPIC CHOLECYSTECTOMY WITH INTRAOPERATIVE CHOLANGIOGRAM;  Surgeon: HAdin Hector MD;  [facility] [facility]  Service: General;  Laterality: N/A;  . LYMPHADENECTOMY Bilateral 01/05/2017   Procedure: PELVIC LYMPHADENECTOMY;  Surgeon: LRaynelle Bring MD;  Location: [facility];  Service: Urology;  Laterality: Bilateral;  . PENILE PROSTHESIS IMPLANT N/A 07/03/2017   Procedure: PENILE PROTHESIS INFLATABLE/ 3 PC/ SCROTAL;  Surgeon: OKathie Rhodes MD;  Location: [facility];  Service: Urology;  Laterality: N/A;  Needs RNFA  . PROSTATE BIOPSY    . ROBOT ASSISTED LAPAROSCOPIC RADICAL PROSTATECTOMY N/A 01/05/2017   Procedure: XI ROBOTIC ASSISTED LAPAROSCOPIC RADICAL PROSTATECTOMY LEVEL 3;  Surgeon: LRaynelle Bring MD;  Location: [facility];  Service: Urology;  Laterality: N/A;  . VASECTOMY    . VASECTOMY REVERSAL  2001    FAMILY HISTORY: family history includes Cancer in his sister.  SOCIAL HISTORY:  reports that he has never smoked. He has never used smokeless tobacco. He reports  that he does not drink alcohol or use drugs.  ALLERGIES: Dairy aid [lactase]  MEDICATIONS:  Current Outpatient Prescriptions  Medication Sig Dispense Refill  . Ascorbic Acid (VITAMIN C) 100 MG tablet Take 250 mg by mouth  daily. Takes 2 daily    . co-enzyme Q-10 50 MG capsule Take 240 mg by mouth daily.    Marland Kitchen MAGNESIUM CITRATE PO Take 2 tablets by mouth 2 (two) times daily.    Marland Kitchen NIACINAMIDE PO Take 2 tablets by mouth 2 (two) times daily.    . Oxycodone HCl 10 MG TABS Take 1 tablet (10 mg total) by mouth every 4 (four) hours as needed. 30 tablet 0   No current facility-administered medications for this encounter.     REVIEW OF SYSTEMS:  On review of systems, the patient reports that he is doing well overall. He denies any chest pain, shortness of breath, cough, fevers, chills, unintended weight changes. He reports poor circulation, night sweats, hearing loss, and wears hearing aids. Pt wears glasses. He reports a history of heartburn but denies abdominal pain, nausea or vomiting. He has chronic low back pain. He reports significant improvement in his LUTS with less frequency, urgency and nocturia.  He reports mild  post void dribbling but only notes rare occasion of stress incontinence if he sneezes after having postponed urination for any time but otherwise has regained excellent control of his bladder function.  He no longer requires pads daily. He is s/p recent penile prosthesis implant 07/03/17 for his ED.  He has recovered well from this procedure and has been able to use the device with success. A complete review of systems is obtained and is otherwise negative.   PHYSICAL EXAM:  Wt Readings from Last 3 Encounters:  07/03/17 214 lb 4.6 oz (97.2 kg)  06/14/17 212 lb 6 oz (96.3 kg)  04/11/17 219 lb (99.3 kg)   Temp Readings from Last 3 Encounters:  07/04/17 97.8 F (36.6 C) (Oral)  06/14/17 97.9 F (36.6 C) (Oral)  01/07/17 98.1 F (36.7 C) (Oral)   BP Readings from Last 3 Encounters:  07/04/17 101/62  06/14/17 107/76  04/11/17 114/71   Pulse Readings from Last 3 Encounters:  07/04/17 82  06/14/17 67  04/11/17 68    In general this is a well appearing caucasian male in no acute distress. He is  alert and oriented x4 and appropriate throughout the examination. HEENT reveals that the patient is normocephalic, atraumatic. EOMs are intact. PERRLA. Skin is intact without any evidence of gross lesions. Cardiovascular exam reveals a regular rate and rhythm, no clicks rubs or murmurs are auscultated. Chest is clear to auscultation bilaterally. Abdomen has active bowel sounds in all quadrants and is intact. The abdomen is soft, non tender, non distended. Lower extremities are negative for pretibial pitting edema, deep calf tenderness, cyanosis or clubbing.  KPS = 100  100 - Normal; no complaints; no evidence of disease. 90   - Able to carry on normal activity; minor signs or symptoms of disease. 80   - Normal activity with effort; some signs or symptoms of disease. 39   - Cares for self; unable to carry on normal activity or to do active work. 60   - Requires occasional assistance, but is able to care for most of his personal needs. 50   - Requires considerable assistance and frequent medical care. 76   - Disabled; requires special care and assistance. 30   - Severely disabled; hospital admission is indicated  although death not imminent. 30   - Very sick; hospital admission necessary; active supportive treatment necessary. 10   - Moribund; fatal processes progressing rapidly. 0     - Dead  Karnofsky DA, Abelmann [place], Craver LS and Burchenal [facility] [phone]) The use of the nitrogen mustards in the palliative treatment of carcinoma: with particular reference to bronchogenic carcinoma Cancer 1 634-56   LABORATORY DATA:  Lab Results  Component Value Date   WBC 5.4 06/14/2017   HGB 14.3 06/14/2017   HCT 40.2 06/14/2017   MCV 82.7 06/14/2017   PLT 207 06/14/2017   Lab Results  Component Value Date   NA 139 06/14/2017   K 4.5 06/14/2017   CL 103 06/14/2017   CO2 28 06/14/2017   No results found for: ALT, AST, GGT, ALKPHOS, BILITOT   RADIOGRAPHY: No results found.    IMPRESSION/PLAN: 56 y.o.  gentleman with a high risk, stage pT3b N0 Mx adenocarcinoma of the prostate with a Gleason's score of 5+5 and a pre-op PSA of 12.7.  Surgical pathology following radiacal prostatectomy with BPLND shows extensive extraprostatic extension at the apex, mid and base bilaterally, bilateral seminal vesicles and lymphovascular invasion.  Additionally, there were multiple positive margins with Gleason 5+5 at bilateral apex, right posterior mid, bilateral posterior base and right anterior near bladder neck.  12/12 lymph nodes were negative for malignancy.  PSA 02/10/17 was 0.036 and most recently, on 03/28/17 was 0.058.    Today, I met with the patient and his wife and reviewed the findings and workup thus far. We reviewed the the implications of positive margins, extracapsular extension, and seminal vesicle involvement on the risk of prostate cancer recurrence. We discussed the fact that his PSA has remained detectable since the time of surgery indicating persistent disease.  We reviewed the role for early salvage in patients with evidence of persistent disease on pathology and postoperative detectable PSA, which can lead to excellent results in terms of disease control and survival. We discussed radiation treatment directed to the prostatic fossa with regard to the logistics and delivery of external beam radiation treatment.  The patient would like to proceed with adjuvant radiotherapy and is scheduled for CT SIM at 10am on Friday, 08/25/17.  He has continued on ADT since 03/2017 which he is tolerating well aside from hot flashes. The plan is to continue ADT throughout his course of radiotherapy and revisit the discussion regarding total length of ADT with Dr. Alinda Money at that time.  I enjoyed meeting with him and his wife today, and look forward to participating in the care of this very nice gentleman.   I spent 30 minutes face to face with the patient and more than 50% of that time was spent in counseling and/or  coordination of care.     Nicholos Johns, PA-C

## 2017-08-24 NOTE — Addendum Note (Signed)
Encounter addended by: Malena Edman, RN on: 08/24/2017 10:57 AM<BR>    Actions taken: Charge Capture section accepted

## 2017-08-25 ENCOUNTER — Encounter: Payer: Self-pay | Admitting: Medical Oncology

## 2017-08-25 ENCOUNTER — Ambulatory Visit
Admission: RE | Admit: 2017-08-25 | Discharge: 2017-08-25 | Disposition: A | Discharge status: Home / Self Care | Payer: BLUE CROSS/BLUE SHIELD | Source: Ambulatory Visit | Attending: Radiation Oncology | Admitting: Radiation Oncology

## 2017-08-25 DIAGNOSIS — C61 Malignant neoplasm of prostate: Secondary | ICD-10-CM

## 2017-08-25 DIAGNOSIS — Z510 Encounter for antineoplastic radiation therapy: Secondary | ICD-10-CM | POA: Diagnosis not present

## 2017-08-25 NOTE — Progress Notes (Signed)
  Radiation Oncology         [phone] ________________________________  Name: Charles [name] MRN: [id number]  Date: 08/25/2017  DOB: [birth year]  SIMULATION AND TREATMENT PLANNING NOTE    ICD-10-CM   1. Prostate cancer ([place]) C61     DIAGNOSIS:  56 y.o. gentleman with stage pT3b N0 Mx adenocarcinoma of the prostate with a Gleason's score of 5+5 and a pre-op PSA of 12.7 and post-op rising PSA of 0.058.  (pathology: multiple positive margins, extensive extraprostatic extension with involvment of bilateral SVs)  NARRATIVE:  The patient was brought to the [facility].  Identity was confirmed.  All relevant records and images related to the planned course of therapy were reviewed.  The patient freely provided informed written consent to proceed with treatment after reviewing the details related to the planned course of therapy. The consent form was witnessed and verified by the simulation staff.  Then, the patient was set-up in a stable reproducible  supine position for radiation therapy.  CT images were obtained.  Surface markings were placed.  The CT images were loaded into the planning software.  Then the target and avoidance structures were contoured.  Treatment planning then occurred.  The radiation prescription was entered and confirmed.  Then, I designed and supervised the construction of a total of 1 medically necessary complex treatment device consisting of Vac-Loc positioner.  I have requested : Intensity Modulated Radiotherapy (IMRT) is medically necessary for this case for the following reason:  Rectal sparing.  PLAN:  The patient will receive 68.4 Gy in 38 fractions.  ________________________________  Sheral Apley Tammi Klippel, M.D.  This document serves as a record of services personally performed by Tyler Pita, MD. It was created on his behalf by Arlyce Harman, a trained medical scribe. The creation of this record is based on the scribe's personal observations and  the provider's statements to them. This document has been checked and approved by the attending provider.

## 2017-08-25 NOTE — Progress Notes (Signed)
Charles [name] here today for CT simulation. He states he has been doing well except a few hot flashed from the ADT. His wife states that she has been amazed at this great attitude. After surgery she expressed the disappointment of the pathology report but happy to moving forward with treatment and hopeful for great outcome. I will continue to follow.

## 2017-09-04 DIAGNOSIS — C61 Malignant neoplasm of prostate: Secondary | ICD-10-CM | POA: Diagnosis not present

## 2017-09-04 DIAGNOSIS — Z510 Encounter for antineoplastic radiation therapy: Secondary | ICD-10-CM | POA: Diagnosis not present

## 2017-09-05 ENCOUNTER — Ambulatory Visit
Admission: RE | Admit: 2017-09-05 | Discharge: 2017-09-05 | Disposition: A | Discharge status: Home / Self Care | Payer: BLUE CROSS/BLUE SHIELD | Source: Ambulatory Visit | Attending: Radiation Oncology | Admitting: Radiation Oncology

## 2017-09-05 DIAGNOSIS — C61 Malignant neoplasm of prostate: Secondary | ICD-10-CM | POA: Diagnosis not present

## 2017-09-05 DIAGNOSIS — Z510 Encounter for antineoplastic radiation therapy: Secondary | ICD-10-CM | POA: Diagnosis not present

## 2017-09-06 ENCOUNTER — Ambulatory Visit
Admission: RE | Admit: 2017-09-06 | Discharge: 2017-09-06 | Disposition: A | Discharge status: Home / Self Care | Payer: BLUE CROSS/BLUE SHIELD | Source: Ambulatory Visit | Attending: Radiation Oncology | Admitting: Radiation Oncology

## 2017-09-06 DIAGNOSIS — C61 Malignant neoplasm of prostate: Secondary | ICD-10-CM | POA: Diagnosis not present

## 2017-09-06 DIAGNOSIS — Z510 Encounter for antineoplastic radiation therapy: Secondary | ICD-10-CM | POA: Diagnosis not present

## 2017-09-07 ENCOUNTER — Ambulatory Visit
Admission: RE | Admit: 2017-09-07 | Discharge: 2017-09-07 | Disposition: A | Discharge status: Home / Self Care | Payer: BLUE CROSS/BLUE SHIELD | Source: Ambulatory Visit | Attending: Radiation Oncology | Admitting: Radiation Oncology

## 2017-09-07 ENCOUNTER — Ambulatory Visit: Admission: RE | Admit: 2017-09-07 | Payer: BLUE CROSS/BLUE SHIELD | Source: Ambulatory Visit

## 2017-09-07 DIAGNOSIS — Z510 Encounter for antineoplastic radiation therapy: Secondary | ICD-10-CM | POA: Diagnosis not present

## 2017-09-07 DIAGNOSIS — C61 Malignant neoplasm of prostate: Secondary | ICD-10-CM | POA: Diagnosis not present

## 2017-09-08 ENCOUNTER — Ambulatory Visit
Admission: RE | Admit: 2017-09-08 | Discharge: 2017-09-08 | Disposition: A | Discharge status: Home / Self Care | Payer: BLUE CROSS/BLUE SHIELD | Source: Ambulatory Visit | Attending: Radiation Oncology | Admitting: Radiation Oncology

## 2017-09-08 DIAGNOSIS — C61 Malignant neoplasm of prostate: Secondary | ICD-10-CM | POA: Diagnosis not present

## 2017-09-08 DIAGNOSIS — Z510 Encounter for antineoplastic radiation therapy: Secondary | ICD-10-CM | POA: Diagnosis not present

## 2017-09-11 ENCOUNTER — Ambulatory Visit
Admission: RE | Admit: 2017-09-11 | Discharge: 2017-09-11 | Disposition: A | Discharge status: Home / Self Care | Payer: BLUE CROSS/BLUE SHIELD | Source: Ambulatory Visit | Attending: Radiation Oncology | Admitting: Radiation Oncology

## 2017-09-11 DIAGNOSIS — Z510 Encounter for antineoplastic radiation therapy: Secondary | ICD-10-CM | POA: Diagnosis not present

## 2017-09-11 DIAGNOSIS — C61 Malignant neoplasm of prostate: Secondary | ICD-10-CM | POA: Diagnosis not present

## 2017-09-12 ENCOUNTER — Ambulatory Visit
Admission: RE | Admit: 2017-09-12 | Discharge: 2017-09-12 | Disposition: A | Discharge status: Home / Self Care | Payer: BLUE CROSS/BLUE SHIELD | Source: Ambulatory Visit | Attending: Radiation Oncology | Admitting: Radiation Oncology

## 2017-09-12 DIAGNOSIS — Z510 Encounter for antineoplastic radiation therapy: Secondary | ICD-10-CM | POA: Diagnosis not present

## 2017-09-12 DIAGNOSIS — C61 Malignant neoplasm of prostate: Secondary | ICD-10-CM | POA: Diagnosis not present

## 2017-09-13 ENCOUNTER — Ambulatory Visit
Admission: RE | Admit: 2017-09-13 | Discharge: 2017-09-13 | Disposition: A | Discharge status: Home / Self Care | Payer: BLUE CROSS/BLUE SHIELD | Source: Ambulatory Visit | Attending: Radiation Oncology | Admitting: Radiation Oncology

## 2017-09-13 DIAGNOSIS — Z510 Encounter for antineoplastic radiation therapy: Secondary | ICD-10-CM | POA: Diagnosis not present

## 2017-09-13 DIAGNOSIS — C61 Malignant neoplasm of prostate: Secondary | ICD-10-CM | POA: Diagnosis not present

## 2017-09-14 ENCOUNTER — Ambulatory Visit
Admission: RE | Admit: 2017-09-14 | Discharge: 2017-09-14 | Disposition: A | Discharge status: Home / Self Care | Payer: BLUE CROSS/BLUE SHIELD | Source: Ambulatory Visit | Attending: Radiation Oncology | Admitting: Radiation Oncology

## 2017-09-14 DIAGNOSIS — Z510 Encounter for antineoplastic radiation therapy: Secondary | ICD-10-CM | POA: Diagnosis not present

## 2017-09-14 DIAGNOSIS — C61 Malignant neoplasm of prostate: Secondary | ICD-10-CM | POA: Diagnosis not present

## 2017-09-15 ENCOUNTER — Ambulatory Visit
Admission: RE | Admit: 2017-09-15 | Discharge: 2017-09-15 | Disposition: A | Discharge status: Home / Self Care | Payer: BLUE CROSS/BLUE SHIELD | Source: Ambulatory Visit | Attending: Radiation Oncology | Admitting: Radiation Oncology

## 2017-09-15 DIAGNOSIS — Z510 Encounter for antineoplastic radiation therapy: Secondary | ICD-10-CM | POA: Diagnosis not present

## 2017-09-15 DIAGNOSIS — C61 Malignant neoplasm of prostate: Secondary | ICD-10-CM | POA: Diagnosis not present

## 2017-09-18 ENCOUNTER — Ambulatory Visit
Admission: RE | Admit: 2017-09-18 | Discharge: 2017-09-18 | Disposition: A | Discharge status: Home / Self Care | Payer: BLUE CROSS/BLUE SHIELD | Source: Ambulatory Visit | Attending: Radiation Oncology | Admitting: Radiation Oncology

## 2017-09-18 DIAGNOSIS — Z510 Encounter for antineoplastic radiation therapy: Secondary | ICD-10-CM | POA: Diagnosis not present

## 2017-09-18 DIAGNOSIS — C61 Malignant neoplasm of prostate: Secondary | ICD-10-CM | POA: Diagnosis not present

## 2017-09-19 ENCOUNTER — Ambulatory Visit
Admission: RE | Admit: 2017-09-19 | Discharge: 2017-09-19 | Disposition: A | Discharge status: Home / Self Care | Payer: BLUE CROSS/BLUE SHIELD | Source: Ambulatory Visit | Attending: Radiation Oncology | Admitting: Radiation Oncology

## 2017-09-19 DIAGNOSIS — Z510 Encounter for antineoplastic radiation therapy: Secondary | ICD-10-CM | POA: Diagnosis not present

## 2017-09-19 DIAGNOSIS — C61 Malignant neoplasm of prostate: Secondary | ICD-10-CM | POA: Diagnosis not present

## 2017-09-20 ENCOUNTER — Ambulatory Visit
Admission: RE | Admit: 2017-09-20 | Discharge: 2017-09-20 | Disposition: A | Discharge status: Home / Self Care | Payer: BLUE CROSS/BLUE SHIELD | Source: Ambulatory Visit | Attending: Radiation Oncology | Admitting: Radiation Oncology

## 2017-09-20 DIAGNOSIS — C61 Malignant neoplasm of prostate: Secondary | ICD-10-CM | POA: Diagnosis not present

## 2017-09-20 DIAGNOSIS — Z510 Encounter for antineoplastic radiation therapy: Secondary | ICD-10-CM | POA: Diagnosis not present

## 2017-09-21 ENCOUNTER — Ambulatory Visit: Payer: BLUE CROSS/BLUE SHIELD

## 2017-09-22 ENCOUNTER — Ambulatory Visit: Payer: BLUE CROSS/BLUE SHIELD

## 2017-09-25 ENCOUNTER — Ambulatory Visit: Payer: BLUE CROSS/BLUE SHIELD

## 2017-09-26 ENCOUNTER — Ambulatory Visit
Admission: RE | Admit: 2017-09-26 | Discharge: 2017-09-26 | Disposition: A | Discharge status: Home / Self Care | Payer: BLUE CROSS/BLUE SHIELD | Source: Ambulatory Visit | Attending: Radiation Oncology | Admitting: Radiation Oncology

## 2017-09-26 DIAGNOSIS — Z510 Encounter for antineoplastic radiation therapy: Secondary | ICD-10-CM | POA: Diagnosis not present

## 2017-09-26 DIAGNOSIS — C61 Malignant neoplasm of prostate: Secondary | ICD-10-CM | POA: Diagnosis not present

## 2017-09-27 ENCOUNTER — Ambulatory Visit
Admission: RE | Admit: 2017-09-27 | Discharge: 2017-09-27 | Disposition: A | Discharge status: Home / Self Care | Payer: BLUE CROSS/BLUE SHIELD | Source: Ambulatory Visit | Attending: Radiation Oncology | Admitting: Radiation Oncology

## 2017-09-27 DIAGNOSIS — C61 Malignant neoplasm of prostate: Secondary | ICD-10-CM | POA: Diagnosis not present

## 2017-09-27 DIAGNOSIS — Z510 Encounter for antineoplastic radiation therapy: Secondary | ICD-10-CM | POA: Diagnosis not present

## 2017-09-28 ENCOUNTER — Ambulatory Visit
Admission: RE | Admit: 2017-09-28 | Discharge: 2017-09-28 | Disposition: A | Discharge status: Home / Self Care | Payer: BLUE CROSS/BLUE SHIELD | Source: Ambulatory Visit | Attending: Radiation Oncology | Admitting: Radiation Oncology

## 2017-09-28 DIAGNOSIS — Z510 Encounter for antineoplastic radiation therapy: Secondary | ICD-10-CM | POA: Diagnosis not present

## 2017-09-28 DIAGNOSIS — C61 Malignant neoplasm of prostate: Secondary | ICD-10-CM | POA: Diagnosis not present

## 2017-09-29 ENCOUNTER — Ambulatory Visit
Admission: RE | Admit: 2017-09-29 | Discharge: 2017-09-29 | Disposition: A | Discharge status: Home / Self Care | Payer: BLUE CROSS/BLUE SHIELD | Source: Ambulatory Visit | Attending: Radiation Oncology | Admitting: Radiation Oncology

## 2017-09-29 DIAGNOSIS — Z510 Encounter for antineoplastic radiation therapy: Secondary | ICD-10-CM | POA: Diagnosis not present

## 2017-09-29 DIAGNOSIS — C61 Malignant neoplasm of prostate: Secondary | ICD-10-CM | POA: Diagnosis not present

## 2017-10-02 ENCOUNTER — Ambulatory Visit
Admission: RE | Admit: 2017-10-02 | Discharge: 2017-10-02 | Disposition: A | Discharge status: Home / Self Care | Payer: BLUE CROSS/BLUE SHIELD | Source: Ambulatory Visit | Attending: Radiation Oncology | Admitting: Radiation Oncology

## 2017-10-02 DIAGNOSIS — C61 Malignant neoplasm of prostate: Secondary | ICD-10-CM | POA: Diagnosis not present

## 2017-10-02 DIAGNOSIS — Z510 Encounter for antineoplastic radiation therapy: Secondary | ICD-10-CM | POA: Diagnosis not present

## 2017-10-03 ENCOUNTER — Ambulatory Visit
Admission: RE | Admit: 2017-10-03 | Discharge: 2017-10-03 | Disposition: A | Discharge status: Home / Self Care | Payer: BLUE CROSS/BLUE SHIELD | Source: Ambulatory Visit | Attending: Radiation Oncology | Admitting: Radiation Oncology

## 2017-10-03 DIAGNOSIS — C61 Malignant neoplasm of prostate: Secondary | ICD-10-CM | POA: Diagnosis not present

## 2017-10-03 DIAGNOSIS — Z510 Encounter for antineoplastic radiation therapy: Secondary | ICD-10-CM | POA: Diagnosis not present

## 2017-10-04 ENCOUNTER — Ambulatory Visit
Admission: RE | Admit: 2017-10-04 | Discharge: 2017-10-04 | Disposition: A | Discharge status: Home / Self Care | Payer: BLUE CROSS/BLUE SHIELD | Source: Ambulatory Visit | Attending: Radiation Oncology | Admitting: Radiation Oncology

## 2017-10-04 DIAGNOSIS — C61 Malignant neoplasm of prostate: Secondary | ICD-10-CM | POA: Diagnosis not present

## 2017-10-04 DIAGNOSIS — Z510 Encounter for antineoplastic radiation therapy: Secondary | ICD-10-CM | POA: Diagnosis not present

## 2017-10-05 ENCOUNTER — Ambulatory Visit
Admission: RE | Admit: 2017-10-05 | Discharge: 2017-10-05 | Disposition: A | Discharge status: Home / Self Care | Payer: BLUE CROSS/BLUE SHIELD | Source: Ambulatory Visit | Attending: Radiation Oncology | Admitting: Radiation Oncology

## 2017-10-05 DIAGNOSIS — C61 Malignant neoplasm of prostate: Secondary | ICD-10-CM | POA: Diagnosis not present

## 2017-10-05 DIAGNOSIS — Z510 Encounter for antineoplastic radiation therapy: Secondary | ICD-10-CM | POA: Diagnosis not present

## 2017-10-06 ENCOUNTER — Ambulatory Visit
Admission: RE | Admit: 2017-10-06 | Discharge: 2017-10-06 | Disposition: A | Discharge status: Home / Self Care | Payer: BLUE CROSS/BLUE SHIELD | Source: Ambulatory Visit | Attending: Radiation Oncology | Admitting: Radiation Oncology

## 2017-10-06 DIAGNOSIS — Z510 Encounter for antineoplastic radiation therapy: Secondary | ICD-10-CM | POA: Diagnosis not present

## 2017-10-06 DIAGNOSIS — C61 Malignant neoplasm of prostate: Secondary | ICD-10-CM | POA: Diagnosis not present

## 2017-10-08 IMAGING — NM NM BONE WHOLE BODY
2 series · 2 of 2 positions shown · non-contrast
Comparison: None.

CLINICAL DATA: Prostate cancer

EXAM:
NUCLEAR MEDICINE WHOLE BODY BONE SCAN
TECHNIQUE: Whole body anterior and posterior images were obtained approximately
3 hours after intravenous injection of radiopharmaceutical.
RADIOPHARMACEUTICALS:  21.3 mCi Zechnetium-PPm MDP IV

[Series 1: whole body · 2.66mm/px · 1 of 1 slices shown (1 of 2)]
[im 1/1]
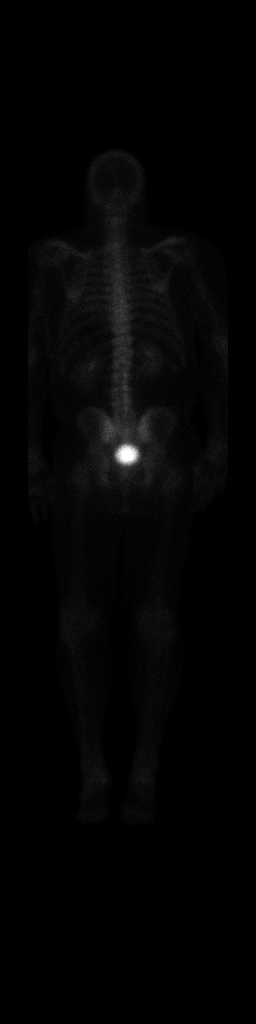

[Series 1: whole body · 2.66mm/px · 1 of 1 slices shown (2 of 2)]
[im 1/1]
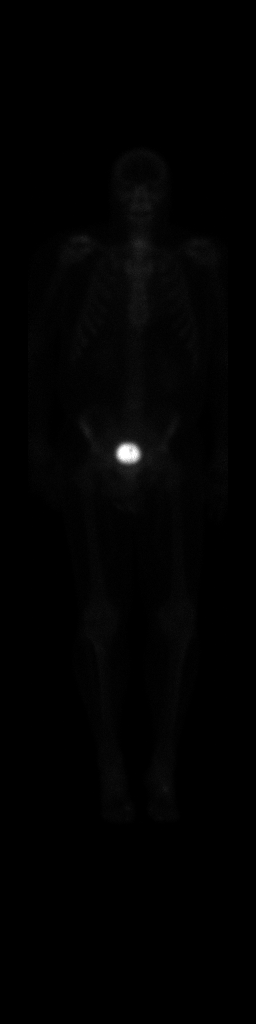

[2 of 2 positions shown; findings below may reference images not displayed]

FINDINGS: No areas of suspicious bony uptake.  Soft tissue activity is normal.
IMPRESSION: No evidence of bony metastatic disease.

## 2017-10-09 ENCOUNTER — Ambulatory Visit
Admission: RE | Admit: 2017-10-09 | Discharge: 2017-10-09 | Disposition: A | Discharge status: Home / Self Care | Payer: BLUE CROSS/BLUE SHIELD | Source: Ambulatory Visit | Attending: Radiation Oncology | Admitting: Radiation Oncology

## 2017-10-09 DIAGNOSIS — Z510 Encounter for antineoplastic radiation therapy: Secondary | ICD-10-CM | POA: Diagnosis not present

## 2017-10-09 DIAGNOSIS — C61 Malignant neoplasm of prostate: Secondary | ICD-10-CM | POA: Diagnosis not present

## 2017-10-10 ENCOUNTER — Ambulatory Visit
Admission: RE | Admit: 2017-10-10 | Discharge: 2017-10-10 | Disposition: A | Discharge status: Home / Self Care | Payer: BLUE CROSS/BLUE SHIELD | Source: Ambulatory Visit | Attending: Radiation Oncology | Admitting: Radiation Oncology

## 2017-10-10 DIAGNOSIS — C61 Malignant neoplasm of prostate: Secondary | ICD-10-CM | POA: Diagnosis not present

## 2017-10-10 DIAGNOSIS — Z510 Encounter for antineoplastic radiation therapy: Secondary | ICD-10-CM | POA: Diagnosis not present

## 2017-10-11 ENCOUNTER — Ambulatory Visit
Admission: RE | Admit: 2017-10-11 | Discharge: 2017-10-11 | Disposition: A | Discharge status: Home / Self Care | Payer: BLUE CROSS/BLUE SHIELD | Source: Ambulatory Visit | Attending: Radiation Oncology | Admitting: Radiation Oncology

## 2017-10-11 DIAGNOSIS — Z510 Encounter for antineoplastic radiation therapy: Secondary | ICD-10-CM | POA: Diagnosis not present

## 2017-10-11 DIAGNOSIS — C61 Malignant neoplasm of prostate: Secondary | ICD-10-CM | POA: Diagnosis not present

## 2017-10-12 ENCOUNTER — Ambulatory Visit
Admission: RE | Admit: 2017-10-12 | Discharge: 2017-10-12 | Disposition: A | Discharge status: Home / Self Care | Payer: BLUE CROSS/BLUE SHIELD | Source: Ambulatory Visit | Attending: Radiation Oncology | Admitting: Radiation Oncology

## 2017-10-12 DIAGNOSIS — Z510 Encounter for antineoplastic radiation therapy: Secondary | ICD-10-CM | POA: Diagnosis not present

## 2017-10-12 DIAGNOSIS — C61 Malignant neoplasm of prostate: Secondary | ICD-10-CM | POA: Diagnosis not present

## 2017-10-13 ENCOUNTER — Ambulatory Visit
Admission: RE | Admit: 2017-10-13 | Discharge: 2017-10-13 | Disposition: A | Discharge status: Home / Self Care | Payer: BLUE CROSS/BLUE SHIELD | Source: Ambulatory Visit | Attending: Radiation Oncology | Admitting: Radiation Oncology

## 2017-10-13 DIAGNOSIS — C61 Malignant neoplasm of prostate: Secondary | ICD-10-CM | POA: Diagnosis not present

## 2017-10-13 DIAGNOSIS — Z510 Encounter for antineoplastic radiation therapy: Secondary | ICD-10-CM | POA: Diagnosis not present

## 2017-10-16 ENCOUNTER — Ambulatory Visit
Admission: RE | Admit: 2017-10-16 | Discharge: 2017-10-16 | Disposition: A | Discharge status: Home / Self Care | Payer: BLUE CROSS/BLUE SHIELD | Source: Ambulatory Visit | Attending: Radiation Oncology | Admitting: Radiation Oncology

## 2017-10-16 DIAGNOSIS — Z510 Encounter for antineoplastic radiation therapy: Secondary | ICD-10-CM | POA: Diagnosis not present

## 2017-10-16 DIAGNOSIS — C61 Malignant neoplasm of prostate: Secondary | ICD-10-CM | POA: Diagnosis not present

## 2017-10-17 ENCOUNTER — Ambulatory Visit
Admission: RE | Admit: 2017-10-17 | Discharge: 2017-10-17 | Disposition: A | Discharge status: Home / Self Care | Payer: BLUE CROSS/BLUE SHIELD | Source: Ambulatory Visit | Attending: Radiation Oncology | Admitting: Radiation Oncology

## 2017-10-17 DIAGNOSIS — C61 Malignant neoplasm of prostate: Secondary | ICD-10-CM | POA: Diagnosis not present

## 2017-10-17 DIAGNOSIS — Z510 Encounter for antineoplastic radiation therapy: Secondary | ICD-10-CM | POA: Diagnosis not present

## 2017-10-18 ENCOUNTER — Ambulatory Visit
Admission: RE | Admit: 2017-10-18 | Discharge: 2017-10-18 | Disposition: A | Discharge status: Home / Self Care | Payer: BLUE CROSS/BLUE SHIELD | Source: Ambulatory Visit | Attending: Radiation Oncology | Admitting: Radiation Oncology

## 2017-10-18 DIAGNOSIS — N5231 Erectile dysfunction following radical prostatectomy: Secondary | ICD-10-CM | POA: Diagnosis not present

## 2017-10-18 DIAGNOSIS — C61 Malignant neoplasm of prostate: Secondary | ICD-10-CM | POA: Diagnosis not present

## 2017-10-18 DIAGNOSIS — Z510 Encounter for antineoplastic radiation therapy: Secondary | ICD-10-CM | POA: Diagnosis not present

## 2017-10-19 ENCOUNTER — Ambulatory Visit
Admission: RE | Admit: 2017-10-19 | Discharge: 2017-10-19 | Disposition: A | Discharge status: Home / Self Care | Payer: BLUE CROSS/BLUE SHIELD | Source: Ambulatory Visit | Attending: Radiation Oncology | Admitting: Radiation Oncology

## 2017-10-19 DIAGNOSIS — C61 Malignant neoplasm of prostate: Secondary | ICD-10-CM | POA: Diagnosis not present

## 2017-10-19 DIAGNOSIS — Z510 Encounter for antineoplastic radiation therapy: Secondary | ICD-10-CM | POA: Diagnosis not present

## 2017-10-20 ENCOUNTER — Ambulatory Visit
Admission: RE | Admit: 2017-10-20 | Discharge: 2017-10-20 | Disposition: A | Discharge status: Home / Self Care | Payer: BLUE CROSS/BLUE SHIELD | Source: Ambulatory Visit | Attending: Radiation Oncology | Admitting: Radiation Oncology

## 2017-10-20 DIAGNOSIS — C61 Malignant neoplasm of prostate: Secondary | ICD-10-CM | POA: Diagnosis not present

## 2017-10-20 DIAGNOSIS — Z510 Encounter for antineoplastic radiation therapy: Secondary | ICD-10-CM | POA: Diagnosis not present

## 2017-10-23 ENCOUNTER — Ambulatory Visit
Admission: RE | Admit: 2017-10-23 | Discharge: 2017-10-23 | Disposition: A | Discharge status: Home / Self Care | Payer: BLUE CROSS/BLUE SHIELD | Source: Ambulatory Visit | Attending: Radiation Oncology | Admitting: Radiation Oncology

## 2017-10-23 DIAGNOSIS — C61 Malignant neoplasm of prostate: Secondary | ICD-10-CM | POA: Diagnosis not present

## 2017-10-23 DIAGNOSIS — Z510 Encounter for antineoplastic radiation therapy: Secondary | ICD-10-CM | POA: Diagnosis not present

## 2017-10-24 ENCOUNTER — Ambulatory Visit
Admission: RE | Admit: 2017-10-24 | Discharge: 2017-10-24 | Disposition: A | Discharge status: Home / Self Care | Payer: BLUE CROSS/BLUE SHIELD | Source: Ambulatory Visit | Attending: Radiation Oncology | Admitting: Radiation Oncology

## 2017-10-24 ENCOUNTER — Encounter: Payer: Self-pay | Admitting: Medical Oncology

## 2017-10-24 DIAGNOSIS — C61 Malignant neoplasm of prostate: Secondary | ICD-10-CM | POA: Diagnosis not present

## 2017-10-24 DIAGNOSIS — Z510 Encounter for antineoplastic radiation therapy: Secondary | ICD-10-CM | POA: Diagnosis not present

## 2017-10-25 ENCOUNTER — Ambulatory Visit
Admission: RE | Admit: 2017-10-25 | Discharge: 2017-10-25 | Disposition: A | Discharge status: Home / Self Care | Payer: BLUE CROSS/BLUE SHIELD | Source: Ambulatory Visit | Attending: Radiation Oncology | Admitting: Radiation Oncology

## 2017-10-25 DIAGNOSIS — Z510 Encounter for antineoplastic radiation therapy: Secondary | ICD-10-CM | POA: Diagnosis not present

## 2017-10-25 DIAGNOSIS — N5231 Erectile dysfunction following radical prostatectomy: Secondary | ICD-10-CM | POA: Diagnosis not present

## 2017-10-25 DIAGNOSIS — C61 Malignant neoplasm of prostate: Secondary | ICD-10-CM | POA: Diagnosis not present

## 2017-10-25 DIAGNOSIS — R350 Frequency of micturition: Secondary | ICD-10-CM | POA: Diagnosis not present

## 2017-10-26 ENCOUNTER — Ambulatory Visit
Admission: RE | Admit: 2017-10-26 | Discharge: 2017-10-26 | Disposition: A | Discharge status: Home / Self Care | Payer: BLUE CROSS/BLUE SHIELD | Source: Ambulatory Visit | Attending: Radiation Oncology | Admitting: Radiation Oncology

## 2017-10-26 ENCOUNTER — Ambulatory Visit: Payer: BLUE CROSS/BLUE SHIELD

## 2017-10-26 DIAGNOSIS — C61 Malignant neoplasm of prostate: Secondary | ICD-10-CM | POA: Diagnosis not present

## 2017-10-26 DIAGNOSIS — Z510 Encounter for antineoplastic radiation therapy: Secondary | ICD-10-CM | POA: Diagnosis not present

## 2017-10-27 ENCOUNTER — Ambulatory Visit: Payer: BLUE CROSS/BLUE SHIELD

## 2017-10-27 ENCOUNTER — Ambulatory Visit
Admission: RE | Admit: 2017-10-27 | Discharge: 2017-10-27 | Disposition: A | Discharge status: Home / Self Care | Payer: BLUE CROSS/BLUE SHIELD | Source: Ambulatory Visit | Attending: Radiation Oncology | Admitting: Radiation Oncology

## 2017-10-27 DIAGNOSIS — C61 Malignant neoplasm of prostate: Secondary | ICD-10-CM | POA: Diagnosis not present

## 2017-10-27 DIAGNOSIS — Z510 Encounter for antineoplastic radiation therapy: Secondary | ICD-10-CM | POA: Diagnosis not present

## 2017-10-30 ENCOUNTER — Ambulatory Visit
Admission: RE | Admit: 2017-10-30 | Discharge: 2017-10-30 | Disposition: A | Discharge status: Home / Self Care | Payer: BLUE CROSS/BLUE SHIELD | Source: Ambulatory Visit | Attending: Radiation Oncology | Admitting: Radiation Oncology

## 2017-10-30 ENCOUNTER — Ambulatory Visit: Payer: BLUE CROSS/BLUE SHIELD

## 2017-10-30 DIAGNOSIS — C61 Malignant neoplasm of prostate: Secondary | ICD-10-CM | POA: Diagnosis not present

## 2017-10-30 DIAGNOSIS — Z510 Encounter for antineoplastic radiation therapy: Secondary | ICD-10-CM | POA: Diagnosis not present

## 2017-10-31 ENCOUNTER — Ambulatory Visit
Admission: RE | Admit: 2017-10-31 | Discharge: 2017-10-31 | Disposition: A | Discharge status: Home / Self Care | Payer: BLUE CROSS/BLUE SHIELD | Source: Ambulatory Visit | Attending: Radiation Oncology | Admitting: Radiation Oncology

## 2017-10-31 ENCOUNTER — Encounter: Payer: Self-pay | Admitting: Radiation Oncology

## 2017-10-31 DIAGNOSIS — Z510 Encounter for antineoplastic radiation therapy: Secondary | ICD-10-CM | POA: Diagnosis not present

## 2017-10-31 DIAGNOSIS — C61 Malignant neoplasm of prostate: Secondary | ICD-10-CM | POA: Diagnosis not present

## 2017-11-01 NOTE — Progress Notes (Addendum)
  Radiation Oncology         [phone] ________________________________  Name: DACEN FRAYRE MRN: [id number]  Date: 10/31/2017  DOB: Feb 14, 1961  End of Treatment Note  Diagnosis:   56 y.o.gentleman with stage pT3b N0 Mx adenocarcinoma of the prostate with a Gleason's score of 5+5 and a pre-op PSA of 12.7 and post-op rising PSA of 0.058. (pathology: multiple positive margins, extensive extraprostatic extension with involvment of bilateral SVs)  Indication for treatment:  Curative, Prostatic Fossa Radiotherapy       Radiation treatment dates:   09/05/2017 to 10/31/2017  Site/dose:   The prostatic fossa was treated to 68.4 Gy in 38 fractions of 1.8 Gy  Beams/energy:   The prostatic fossa was treated with IMRT using volumetric arc therapy delivering 6 MV X-rays to clockwise and counterclockwise circumferential arcs with a 90 degree collimator offset to avoid dose scalloping.  Image guidance was performed with daily cone beam CT prior to each fraction to align to gold markers in the prostate and assure proper bladder and rectal fill volumes.  Immobilization was achieved with BodyFix custom mold.  Narrative: The patient tolerated radiation treatment relatively well.   He experienced mild to moderate fatigue and mild bladder irritation including nocturia x3-5, intermittent weak stream, frequency up to 20x's per day, incomplete emptying, and urgency. He denies hematuria, leakage, hesitancy, or straining. He reported bowel movements were soft/loose, and going twice daily. He also experienced some rectal soreness with bowel movements.  Plan: The patient has completed radiation treatment. He will return to radiation oncology clinic for routine followup in one month. I advised him to call or return sooner if he has any questions or concerns related to his recovery or treatment. ________________________________  Sheral Apley. Tammi Klippel, M.D.  This document serves as a record of services personally  performed by Tyler Pita, MD. It was created on his behalf by Arlyce Harman, a trained medical scribe. The creation of this record is based on the scribe's personal observations and the provider's statements to them. This document has been checked and approved by the attending provider.

## 2017-11-06 NOTE — Progress Notes (Signed)
Patient states he has done well with radiation with minimal side effects. He will completer radiation 10/31/17. I asked him to call me with any questions or complaints.

## 2017-11-11 IMAGING — CR DG CHEST 2V
2 series · 2 of 2 positions shown · non-contrast
Comparison: PA and lateral chest x-ray September 21, 2015

CLINICAL DATA: Preoperative examination. Two days of cough and
chest congestion and sore throat. No chest pain or shortness of
breath. History of prostate malignancy. Nonsmoker

EXAM:
CHEST  2 VIEW

[w chest pa]
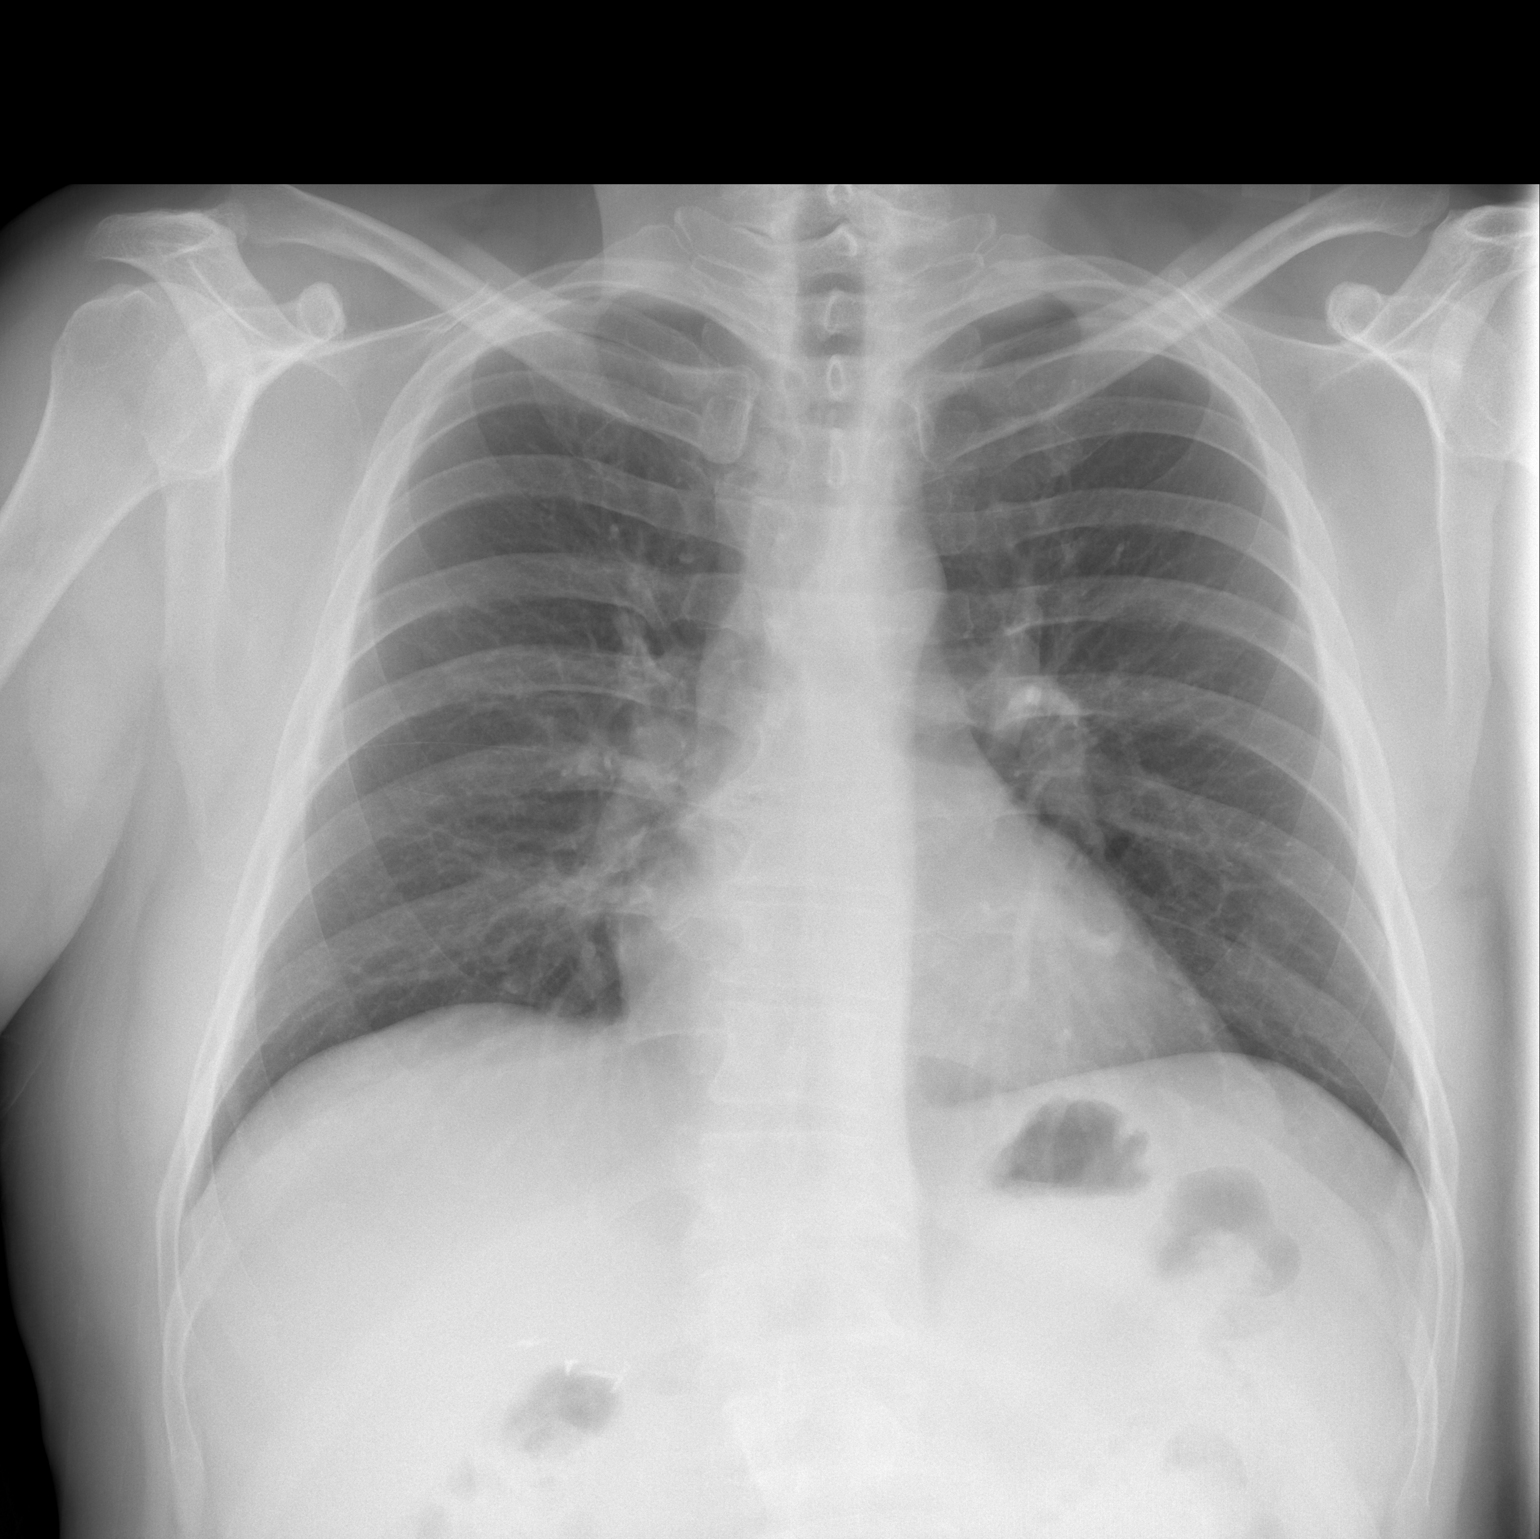

[w chest lat]
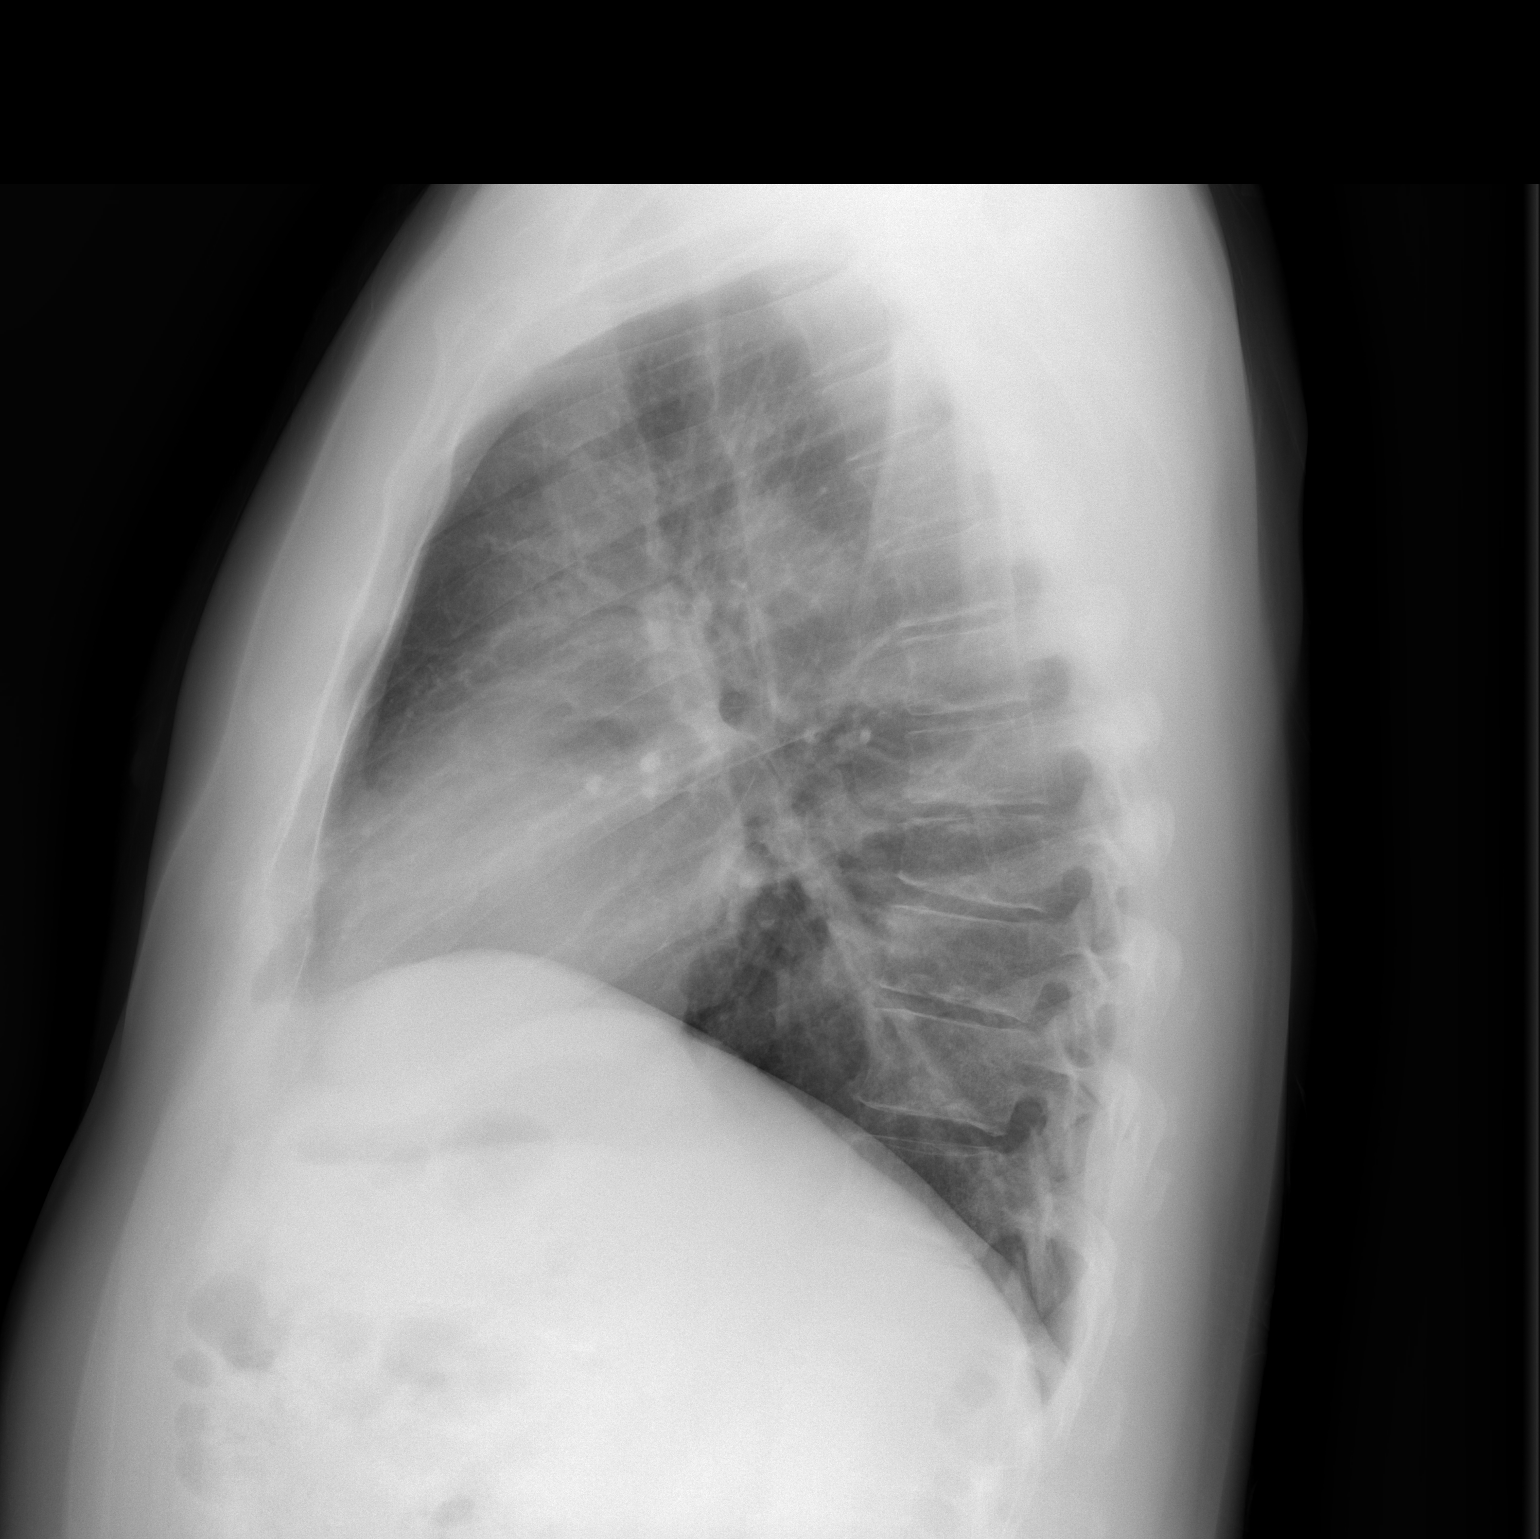

[2 of 2 positions shown; findings below may reference images not displayed]

FINDINGS: The lungs are adequately inflated. There is no focal infiltrate.
There is no pleural effusion. No pulmonary parenchymal nodules or
masses are observed. The heart and pulmonary vascularity are normal.
The mediastinum is normal in width. The trachea is midline. The bony
thorax exhibits no acute abnormality.
IMPRESSION: There is no active cardiopulmonary disease.

## 2017-11-21 DIAGNOSIS — Z0000 Encounter for general adult medical examination without abnormal findings: Secondary | ICD-10-CM | POA: Diagnosis not present

## 2017-11-21 DIAGNOSIS — E785 Hyperlipidemia, unspecified: Secondary | ICD-10-CM | POA: Diagnosis not present

## 2017-11-30 DIAGNOSIS — J0100 Acute maxillary sinusitis, unspecified: Secondary | ICD-10-CM | POA: Diagnosis not present

## 2017-11-30 DIAGNOSIS — J209 Acute bronchitis, unspecified: Secondary | ICD-10-CM | POA: Diagnosis not present

## 2018-01-04 DIAGNOSIS — J383 Other diseases of vocal cords: Secondary | ICD-10-CM | POA: Diagnosis not present

## 2018-01-04 DIAGNOSIS — T161XXA Foreign body in right ear, initial encounter: Secondary | ICD-10-CM | POA: Diagnosis not present

## 2018-03-20 DIAGNOSIS — Z79899 Other long term (current) drug therapy: Secondary | ICD-10-CM | POA: Diagnosis not present

## 2018-03-20 DIAGNOSIS — Z9079 Acquired absence of other genital organ(s): Secondary | ICD-10-CM | POA: Diagnosis not present

## 2018-03-20 DIAGNOSIS — C61 Malignant neoplasm of prostate: Secondary | ICD-10-CM | POA: Diagnosis not present

## 2018-03-20 DIAGNOSIS — Z6832 Body mass index (BMI) 32.0-32.9, adult: Secondary | ICD-10-CM | POA: Diagnosis not present

## 2018-03-20 DIAGNOSIS — Z923 Personal history of irradiation: Secondary | ICD-10-CM | POA: Diagnosis not present

## 2018-03-20 DIAGNOSIS — Z9689 Presence of other specified functional implants: Secondary | ICD-10-CM | POA: Diagnosis not present

## 2018-03-20 DIAGNOSIS — Z9889 Other specified postprocedural states: Secondary | ICD-10-CM | POA: Diagnosis not present

## 2018-03-20 DIAGNOSIS — N529 Male erectile dysfunction, unspecified: Secondary | ICD-10-CM | POA: Diagnosis not present

## 2018-03-20 DIAGNOSIS — T839XXD Unspecified complication of genitourinary prosthetic device, implant and graft, subsequent encounter: Secondary | ICD-10-CM | POA: Diagnosis not present

## 2018-04-22 DIAGNOSIS — J209 Acute bronchitis, unspecified: Secondary | ICD-10-CM | POA: Diagnosis not present

## 2018-04-25 DIAGNOSIS — C61 Malignant neoplasm of prostate: Secondary | ICD-10-CM | POA: Diagnosis not present

## 2018-05-02 DIAGNOSIS — C61 Malignant neoplasm of prostate: Secondary | ICD-10-CM | POA: Diagnosis not present

## 2018-05-02 DIAGNOSIS — N393 Stress incontinence (female) (male): Secondary | ICD-10-CM | POA: Diagnosis not present

## 2018-05-02 DIAGNOSIS — N5231 Erectile dysfunction following radical prostatectomy: Secondary | ICD-10-CM | POA: Diagnosis not present

## 2018-10-16 DIAGNOSIS — N5231 Erectile dysfunction following radical prostatectomy: Secondary | ICD-10-CM | POA: Diagnosis not present

## 2018-10-16 DIAGNOSIS — C61 Malignant neoplasm of prostate: Secondary | ICD-10-CM | POA: Diagnosis not present

## 2018-10-23 DIAGNOSIS — N5231 Erectile dysfunction following radical prostatectomy: Secondary | ICD-10-CM | POA: Diagnosis not present

## 2018-10-23 DIAGNOSIS — C61 Malignant neoplasm of prostate: Secondary | ICD-10-CM | POA: Diagnosis not present

## 2018-11-23 DIAGNOSIS — Z0000 Encounter for general adult medical examination without abnormal findings: Secondary | ICD-10-CM | POA: Diagnosis not present

## 2018-11-23 DIAGNOSIS — E785 Hyperlipidemia, unspecified: Secondary | ICD-10-CM | POA: Diagnosis not present

## 2018-11-23 DIAGNOSIS — N400 Enlarged prostate without lower urinary tract symptoms: Secondary | ICD-10-CM | POA: Diagnosis not present

## 2018-11-23 DIAGNOSIS — K219 Gastro-esophageal reflux disease without esophagitis: Secondary | ICD-10-CM | POA: Diagnosis not present

## 2018-12-10 DIAGNOSIS — G5792 Unspecified mononeuropathy of left lower limb: Secondary | ICD-10-CM | POA: Diagnosis not present

## 2018-12-10 DIAGNOSIS — M19071 Primary osteoarthritis, right ankle and foot: Secondary | ICD-10-CM | POA: Diagnosis not present

## 2018-12-10 DIAGNOSIS — G5791 Unspecified mononeuropathy of right lower limb: Secondary | ICD-10-CM | POA: Diagnosis not present

## 2018-12-10 DIAGNOSIS — M19072 Primary osteoarthritis, left ankle and foot: Secondary | ICD-10-CM | POA: Diagnosis not present

## 2018-12-17 DIAGNOSIS — G4761 Periodic limb movement disorder: Secondary | ICD-10-CM | POA: Diagnosis not present

## 2018-12-17 DIAGNOSIS — M5417 Radiculopathy, lumbosacral region: Secondary | ICD-10-CM | POA: Diagnosis not present

## 2018-12-17 DIAGNOSIS — G2581 Restless legs syndrome: Secondary | ICD-10-CM | POA: Diagnosis not present

## 2018-12-26 ENCOUNTER — Ambulatory Visit: Payer: Self-pay | Admitting: Cardiovascular Disease

## 2019-01-16 ENCOUNTER — Ambulatory Visit: Payer: Self-pay | Admitting: Cardiology

## 2019-02-12 DIAGNOSIS — Z114 Encounter for screening for human immunodeficiency virus [HIV]: Secondary | ICD-10-CM | POA: Diagnosis not present

## 2019-02-12 DIAGNOSIS — M47816 Spondylosis without myelopathy or radiculopathy, lumbar region: Secondary | ICD-10-CM | POA: Diagnosis not present

## 2019-02-12 DIAGNOSIS — E785 Hyperlipidemia, unspecified: Secondary | ICD-10-CM | POA: Diagnosis not present

## 2019-02-12 DIAGNOSIS — Z1159 Encounter for screening for other viral diseases: Secondary | ICD-10-CM | POA: Diagnosis not present

## 2019-02-12 DIAGNOSIS — Z8546 Personal history of malignant neoplasm of prostate: Secondary | ICD-10-CM | POA: Diagnosis not present

## 2019-02-12 DIAGNOSIS — M25562 Pain in left knee: Secondary | ICD-10-CM | POA: Diagnosis not present

## 2019-02-12 DIAGNOSIS — Z1321 Encounter for screening for nutritional disorder: Secondary | ICD-10-CM | POA: Diagnosis not present

## 2019-02-12 DIAGNOSIS — I429 Cardiomyopathy, unspecified: Secondary | ICD-10-CM | POA: Diagnosis not present

## 2019-02-12 DIAGNOSIS — K219 Gastro-esophageal reflux disease without esophagitis: Secondary | ICD-10-CM | POA: Diagnosis not present

## 2019-02-12 DIAGNOSIS — M19072 Primary osteoarthritis, left ankle and foot: Secondary | ICD-10-CM | POA: Diagnosis not present

## 2019-02-12 DIAGNOSIS — M19011 Primary osteoarthritis, right shoulder: Secondary | ICD-10-CM | POA: Diagnosis not present

## 2019-02-12 DIAGNOSIS — M25561 Pain in right knee: Secondary | ICD-10-CM | POA: Diagnosis not present

## 2019-02-12 DIAGNOSIS — M19071 Primary osteoarthritis, right ankle and foot: Secondary | ICD-10-CM | POA: Diagnosis not present

## 2019-02-12 DIAGNOSIS — R0602 Shortness of breath: Secondary | ICD-10-CM | POA: Diagnosis not present

## 2019-03-12 DIAGNOSIS — M25511 Pain in right shoulder: Secondary | ICD-10-CM | POA: Diagnosis not present

## 2019-04-23 DIAGNOSIS — C61 Malignant neoplasm of prostate: Secondary | ICD-10-CM | POA: Diagnosis not present

## 2019-04-30 DIAGNOSIS — R350 Frequency of micturition: Secondary | ICD-10-CM | POA: Diagnosis not present

## 2019-04-30 DIAGNOSIS — N5231 Erectile dysfunction following radical prostatectomy: Secondary | ICD-10-CM | POA: Diagnosis not present

## 2019-04-30 DIAGNOSIS — C61 Malignant neoplasm of prostate: Secondary | ICD-10-CM | POA: Diagnosis not present

## 2019-07-03 DIAGNOSIS — M7541 Impingement syndrome of right shoulder: Secondary | ICD-10-CM | POA: Diagnosis not present

## 2019-07-03 DIAGNOSIS — M19011 Primary osteoarthritis, right shoulder: Secondary | ICD-10-CM | POA: Diagnosis not present

## 2019-08-07 DIAGNOSIS — M545 Low back pain: Secondary | ICD-10-CM | POA: Diagnosis not present

## 2019-08-07 DIAGNOSIS — N529 Male erectile dysfunction, unspecified: Secondary | ICD-10-CM | POA: Diagnosis not present

## 2019-08-07 DIAGNOSIS — G8929 Other chronic pain: Secondary | ICD-10-CM | POA: Diagnosis not present

## 2019-08-16 DIAGNOSIS — R112 Nausea with vomiting, unspecified: Secondary | ICD-10-CM | POA: Diagnosis not present

## 2019-08-16 DIAGNOSIS — C61 Malignant neoplasm of prostate: Secondary | ICD-10-CM | POA: Diagnosis not present

## 2019-08-16 DIAGNOSIS — H8113 Benign paroxysmal vertigo, bilateral: Secondary | ICD-10-CM | POA: Diagnosis not present

## 2019-08-30 DIAGNOSIS — E785 Hyperlipidemia, unspecified: Secondary | ICD-10-CM | POA: Diagnosis not present

## 2019-08-30 DIAGNOSIS — H8113 Benign paroxysmal vertigo, bilateral: Secondary | ICD-10-CM | POA: Diagnosis not present

## 2019-09-03 DIAGNOSIS — M419 Scoliosis, unspecified: Secondary | ICD-10-CM | POA: Diagnosis not present

## 2019-09-03 DIAGNOSIS — M5136 Other intervertebral disc degeneration, lumbar region: Secondary | ICD-10-CM | POA: Diagnosis not present

## 2019-10-04 DIAGNOSIS — H8113 Benign paroxysmal vertigo, bilateral: Secondary | ICD-10-CM | POA: Diagnosis not present

## 2019-10-04 DIAGNOSIS — Z23 Encounter for immunization: Secondary | ICD-10-CM | POA: Diagnosis not present

## 2019-10-04 DIAGNOSIS — E785 Hyperlipidemia, unspecified: Secondary | ICD-10-CM | POA: Diagnosis not present

## 2019-10-29 DIAGNOSIS — R42 Dizziness and giddiness: Secondary | ICD-10-CM | POA: Diagnosis not present

## 2019-10-29 DIAGNOSIS — E785 Hyperlipidemia, unspecified: Secondary | ICD-10-CM | POA: Diagnosis not present

## 2019-10-31 DIAGNOSIS — E785 Hyperlipidemia, unspecified: Secondary | ICD-10-CM | POA: Diagnosis not present

## 2019-10-31 DIAGNOSIS — R42 Dizziness and giddiness: Secondary | ICD-10-CM | POA: Diagnosis not present

## 2019-10-31 DIAGNOSIS — Z23 Encounter for immunization: Secondary | ICD-10-CM | POA: Diagnosis not present

## 2019-11-11 DIAGNOSIS — C61 Malignant neoplasm of prostate: Secondary | ICD-10-CM | POA: Diagnosis not present

## 2019-11-21 DIAGNOSIS — R350 Frequency of micturition: Secondary | ICD-10-CM | POA: Diagnosis not present

## 2019-11-21 DIAGNOSIS — N5231 Erectile dysfunction following radical prostatectomy: Secondary | ICD-10-CM | POA: Diagnosis not present

## 2019-11-21 DIAGNOSIS — C61 Malignant neoplasm of prostate: Secondary | ICD-10-CM | POA: Diagnosis not present

## 2019-11-27 DIAGNOSIS — E785 Hyperlipidemia, unspecified: Secondary | ICD-10-CM | POA: Diagnosis not present

## 2019-11-27 DIAGNOSIS — Z0000 Encounter for general adult medical examination without abnormal findings: Secondary | ICD-10-CM | POA: Diagnosis not present

## 2019-11-27 DIAGNOSIS — Z23 Encounter for immunization: Secondary | ICD-10-CM | POA: Diagnosis not present

## 2019-12-02 DIAGNOSIS — E785 Hyperlipidemia, unspecified: Secondary | ICD-10-CM | POA: Diagnosis not present

## 2019-12-02 DIAGNOSIS — R42 Dizziness and giddiness: Secondary | ICD-10-CM | POA: Diagnosis not present

## 2020-01-02 DIAGNOSIS — Z23 Encounter for immunization: Secondary | ICD-10-CM | POA: Diagnosis not present

## 2020-01-03 DIAGNOSIS — R5382 Chronic fatigue, unspecified: Secondary | ICD-10-CM | POA: Diagnosis not present

## 2020-01-03 DIAGNOSIS — Z20822 Contact with and (suspected) exposure to covid-19: Secondary | ICD-10-CM | POA: Diagnosis not present

## 2020-01-03 DIAGNOSIS — J069 Acute upper respiratory infection, unspecified: Secondary | ICD-10-CM | POA: Diagnosis not present

## 2020-01-03 DIAGNOSIS — R110 Nausea: Secondary | ICD-10-CM | POA: Diagnosis not present

## 2020-05-15 DIAGNOSIS — C61 Malignant neoplasm of prostate: Secondary | ICD-10-CM | POA: Diagnosis not present

## 2020-05-20 DIAGNOSIS — C61 Malignant neoplasm of prostate: Secondary | ICD-10-CM | POA: Diagnosis not present

## 2020-05-20 DIAGNOSIS — R350 Frequency of micturition: Secondary | ICD-10-CM | POA: Diagnosis not present

## 2020-06-17 DIAGNOSIS — R42 Dizziness and giddiness: Secondary | ICD-10-CM | POA: Diagnosis not present

## 2020-06-17 DIAGNOSIS — E785 Hyperlipidemia, unspecified: Secondary | ICD-10-CM | POA: Diagnosis not present

## 2020-06-17 DIAGNOSIS — R252 Cramp and spasm: Secondary | ICD-10-CM | POA: Diagnosis not present

## 2020-06-17 DIAGNOSIS — M4726 Other spondylosis with radiculopathy, lumbar region: Secondary | ICD-10-CM | POA: Diagnosis not present

## 2020-07-20 DIAGNOSIS — E785 Hyperlipidemia, unspecified: Secondary | ICD-10-CM | POA: Diagnosis not present

## 2020-09-01 DIAGNOSIS — M7918 Myalgia, other site: Secondary | ICD-10-CM | POA: Diagnosis not present

## 2020-09-01 DIAGNOSIS — G8929 Other chronic pain: Secondary | ICD-10-CM | POA: Diagnosis not present

## 2020-09-01 DIAGNOSIS — M533 Sacrococcygeal disorders, not elsewhere classified: Secondary | ICD-10-CM | POA: Diagnosis not present

## 2020-09-23 DIAGNOSIS — I4510 Unspecified right bundle-branch block: Secondary | ICD-10-CM | POA: Diagnosis not present

## 2020-09-23 DIAGNOSIS — R0789 Other chest pain: Secondary | ICD-10-CM | POA: Diagnosis not present

## 2020-09-23 DIAGNOSIS — Z9889 Other specified postprocedural states: Secondary | ICD-10-CM | POA: Diagnosis not present

## 2020-09-23 DIAGNOSIS — R5383 Other fatigue: Secondary | ICD-10-CM | POA: Diagnosis not present

## 2020-09-23 DIAGNOSIS — I429 Cardiomyopathy, unspecified: Secondary | ICD-10-CM | POA: Diagnosis not present

## 2020-09-25 DIAGNOSIS — I429 Cardiomyopathy, unspecified: Secondary | ICD-10-CM | POA: Diagnosis not present

## 2020-10-08 DIAGNOSIS — R0789 Other chest pain: Secondary | ICD-10-CM | POA: Diagnosis not present

## 2020-10-08 DIAGNOSIS — I429 Cardiomyopathy, unspecified: Secondary | ICD-10-CM | POA: Diagnosis not present

## 2020-10-21 DIAGNOSIS — Z20822 Contact with and (suspected) exposure to covid-19: Secondary | ICD-10-CM | POA: Diagnosis not present

## 2020-10-21 DIAGNOSIS — Z01812 Encounter for preprocedural laboratory examination: Secondary | ICD-10-CM | POA: Diagnosis not present

## 2020-10-23 DIAGNOSIS — R079 Chest pain, unspecified: Secondary | ICD-10-CM | POA: Diagnosis not present

## 2020-11-04 DIAGNOSIS — E785 Hyperlipidemia, unspecified: Secondary | ICD-10-CM | POA: Diagnosis not present

## 2020-11-04 DIAGNOSIS — M545 Low back pain, unspecified: Secondary | ICD-10-CM | POA: Diagnosis not present

## 2020-11-04 DIAGNOSIS — R0789 Other chest pain: Secondary | ICD-10-CM | POA: Diagnosis not present

## 2020-11-04 DIAGNOSIS — G8929 Other chronic pain: Secondary | ICD-10-CM | POA: Diagnosis not present

## 2020-11-23 DIAGNOSIS — Z20828 Contact with and (suspected) exposure to other viral communicable diseases: Secondary | ICD-10-CM | POA: Diagnosis not present

## 2020-11-23 DIAGNOSIS — J069 Acute upper respiratory infection, unspecified: Secondary | ICD-10-CM | POA: Diagnosis not present

## 2020-11-23 DIAGNOSIS — R110 Nausea: Secondary | ICD-10-CM | POA: Diagnosis not present

## 2020-11-23 DIAGNOSIS — J029 Acute pharyngitis, unspecified: Secondary | ICD-10-CM | POA: Diagnosis not present

## 2020-11-29 DIAGNOSIS — J209 Acute bronchitis, unspecified: Secondary | ICD-10-CM | POA: Diagnosis not present

## 2020-12-01 DIAGNOSIS — Z0000 Encounter for general adult medical examination without abnormal findings: Secondary | ICD-10-CM | POA: Diagnosis not present

## 2020-12-01 DIAGNOSIS — E785 Hyperlipidemia, unspecified: Secondary | ICD-10-CM | POA: Diagnosis not present

## 2020-12-01 DIAGNOSIS — C61 Malignant neoplasm of prostate: Secondary | ICD-10-CM | POA: Diagnosis not present

## 2020-12-08 DIAGNOSIS — C61 Malignant neoplasm of prostate: Secondary | ICD-10-CM | POA: Diagnosis not present

## 2020-12-08 DIAGNOSIS — N393 Stress incontinence (female) (male): Secondary | ICD-10-CM | POA: Diagnosis not present

## 2020-12-08 DIAGNOSIS — N5231 Erectile dysfunction following radical prostatectomy: Secondary | ICD-10-CM | POA: Diagnosis not present

## 2021-04-01 DIAGNOSIS — M609 Myositis, unspecified: Secondary | ICD-10-CM | POA: Diagnosis not present

## 2021-04-01 DIAGNOSIS — E785 Hyperlipidemia, unspecified: Secondary | ICD-10-CM | POA: Diagnosis not present

## 2021-04-01 DIAGNOSIS — K219 Gastro-esophageal reflux disease without esophagitis: Secondary | ICD-10-CM | POA: Diagnosis not present

## 2021-04-01 DIAGNOSIS — R0789 Other chest pain: Secondary | ICD-10-CM | POA: Diagnosis not present

## 2021-04-01 DIAGNOSIS — T466X5A Adverse effect of antihyperlipidemic and antiarteriosclerotic drugs, initial encounter: Secondary | ICD-10-CM | POA: Diagnosis not present

## 2022-04-13 ENCOUNTER — Ambulatory Visit (INDEPENDENT_AMBULATORY_CARE_PROVIDER_SITE_OTHER): Payer: No Typology Code available for payment source | Admitting: Pulmonary Disease

## 2022-04-13 ENCOUNTER — Encounter: Payer: Self-pay | Admitting: Pulmonary Disease

## 2022-04-13 VITALS — BP 138/72 | HR 71 | Temp 98.0°F | Ht 68.0 in | Wt 226.4 lb

## 2022-04-13 DIAGNOSIS — J42 Unspecified chronic bronchitis: Secondary | ICD-10-CM | POA: Diagnosis not present

## 2022-04-13 DIAGNOSIS — R053 Chronic cough: Secondary | ICD-10-CM

## 2022-04-13 NOTE — Progress Notes (Signed)
? ? ?Subjective:  ? ?PATIENT ID: Charles [name] GENDER: male DOB: 20-Aug-1961, MRN: [id number] ? ? ?HPI ? ?Chief Complaint  ?Patient presents with  ? Consult  ?  Chronic cough in Dec/ Jan every year  ?  ? ? ?Reason for Visit: New consult for chronic cough ? ?Mr. Charles [name] is a 61 year old male never smoker with NICM, history of prostate cancer s/p radiotherapy 2018, HLD and GERD who was referred for chronic cough by his employer. ? ?[place] records reviewed. He reports longstanding cough for >20 years. He had pneumonia when he was 31 and since then cough recurs in the winter (Dec/Jan) every year. Resolves after winter is over. When he does have it, the cough is deep and loud. When he is sick he has associated shortness of breath and wheezing worsened when laying down but not during other times of the year. Currently not having any active symptoms. ? ?Triggered by cold weather and illness. Only cough syrup with codeine helps. He has been prescribed Cetirizine and omeprazole. He takes as needed basis. Only notices reflux with dairy. Denies association with allergies which are actually well-controlled during the winter. He may have tried albuterol when he was ill. He reports breathing tests but not connected to computer. In general, he prefers to minimize medication use possible. ? ?Social History: ?Never smoker ? ?I have personally reviewed patient's past medical/family/social history, allergies, current medications. ? ?Past Medical History:  ?Diagnosis Date  ? GERD (gastroesophageal reflux disease)   ? Medical history non-contributory   ? Nonischemic cardiomyopathy ([place]) 12/10/2015  ? Prostate cancer ([place])   ?  ? ?Family History  ?Problem Relation Age of Onset  ? Cancer Sister   ?     brain tumor  ?  ? ?Social History  ? ?Occupational History  ? Not on file  ?Tobacco Use  ? Smoking status: Never  ? Smokeless tobacco: Never  ?Vaping Use  ? Vaping Use: Never used  ?Substance and Sexual Activity  ? Alcohol use: No  ?  Drug use: No  ? Sexual activity: Yes  ? ? ?Allergies  ?Allergen Reactions  ? Dairy Aid [Tilactase] Other (See Comments)  ?  Creates acid reflex and weakness   ?  ? ?Outpatient Medications Prior to Visit  ?Medication Sig Dispense Refill  ? co-enzyme Q-10 50 MG capsule Take 240 mg by mouth daily.    ? NIACINAMIDE PO Take 2 tablets by mouth 2 (two) times daily.    ? Ascorbic Acid (VITAMIN C) 100 MG tablet Take 250 mg by mouth daily. Takes 2 daily (Patient not taking: Reported on 04/13/2022)    ? MAGNESIUM CITRATE PO Take 2 tablets by mouth 2 (two) times daily. (Patient not taking: Reported on 04/13/2022)    ? Oxycodone HCl 10 MG TABS Take 1 tablet (10 mg total) by mouth every 4 (four) hours as needed. (Patient not taking: Reported on 04/13/2022) 30 tablet 0  ? ?No facility-administered medications prior to visit.  ? ? ?Review of Systems  ?Constitutional:  Negative for chills, diaphoresis, fever, malaise/fatigue and weight loss.  ?HENT:  Negative for congestion.   ?Respiratory:  Positive for cough. Negative for hemoptysis, sputum production, shortness of breath and wheezing.   ?Cardiovascular:  Negative for chest pain, palpitations and leg swelling.  ? ? ?Objective:  ? ?Vitals:  ? 04/13/22 0903  ?BP: 138/72  ?Pulse: 71  ?Temp: 98 ?F (36.7 ?C)  ?TempSrc: Oral  ?SpO2: 97%  ?Weight: 226 lb 6.4 oz (  102.7 kg)  ?Height: '5\' 8"'$  (1.727 m)  ? ?SpO2: 97 % ?O2 Device: None (Room air) ? ?Physical Exam: ?General: Well-appearing, no acute distress ?HENT: [place], AT ?Eyes: EOMI, no scleral icterus ?Respiratory: Clear to auscultation bilaterally.  No crackles, wheezing or rales ?Cardiovascular: RRR, -M/R/G, no JVD ?Extremities:-Edema,-tenderness ?Neuro: AAO x4, CNII-XII grossly intact ?Psych: Normal mood, normal affect ? ?Data Reviewed: ? ?Imaging: ?CT coronary 12/17/15 - Visualized lung fields with normal parenchyma ?CXR 12/28/16 - Normal ? ?PFT: ?None on file ? ?Labs: ?None on file ? ?   ?Assessment & Plan:  ? ?Discussion: ?61 year old male  never smoker with NICM, history of prostate cancer s/p radiotherapy 2018, HLD and GERD who was referred for chronic cough. Common causes of cough were discussed including upper airway cough syndrome, reflux and undiagnosed obstructive lung disease. Patient agrees that reflux could be contributing however his symptoms are not year round. We reviewed evaluation and management for cough as noted below.  ? ?Chronic cough ?Chronic bronchitis ?ORDER pulmonary function tests ?Recommend PPI if cough develops during other seasons ? ?Health Maintenance ?Immunization History  ?Administered Date(s) Administered  ? Hepatitis B, adult 09/18/2015  ? Influenza Split 09/18/2014, 08/28/2015, 09/18/2015, 08/29/2016, 09/18/2017, 09/18/2018, 09/18/2020  ? Influenza,inj,Quad PF,6+ Mos 10/04/2019  ? Influenza,inj,quad, With Preservative 09/28/2021  ? Influenza-Unspecified 09/19/2019, 10/01/2020  ? PNEUMOCOCCAL CONJUGATE-20 09/28/2021  ? Tdap 10/22/2009, 02/12/2019, 11/27/2019  ? Zoster Recombinat (Shingrix) 10/31/2019, 01/02/2020  ? Zoster, Live 10/22/2019  ? ? ?Orders Placed This Encounter  ?Procedures  ? Pulmonary function test  ?  Standing Status:   Future  ?  Standing Expiration Date:   04/14/2023  ?  Order Specific Question:   Where should this test be performed?  ?  Answer:   [facility]  ?No orders of the defined types were placed in this encounter. ? ? ?No follow-ups on file. After PFTs ? ?I have spent a total time of 45-minutes on the day of the appointment reviewing prior documentation, coordinating care and discussing medical diagnosis and plan with the patient/family. Imaging, labs and tests included in this note have been reviewed and interpreted independently by me. ? ?Charles Saffran Rodman Pickle, MD ?[facility] ?04/13/2022 9:33 AM  ?Office Number [phone] ? ? ?

## 2022-04-13 NOTE — Patient Instructions (Signed)
Chronic cough ?Chronic bronchitis ?ORDER pulmonary function tests ? ?Follow-up with me or NP after PFTs ?

## 2022-05-26 ENCOUNTER — Ambulatory Visit (INDEPENDENT_AMBULATORY_CARE_PROVIDER_SITE_OTHER): Payer: No Typology Code available for payment source | Admitting: Nurse Practitioner

## 2022-05-26 ENCOUNTER — Encounter: Payer: Self-pay | Admitting: Nurse Practitioner

## 2022-05-26 ENCOUNTER — Ambulatory Visit (INDEPENDENT_AMBULATORY_CARE_PROVIDER_SITE_OTHER): Payer: No Typology Code available for payment source | Admitting: Pulmonary Disease

## 2022-05-26 VITALS — BP 116/78 | HR 82 | Temp 98.1°F | Ht 69.0 in | Wt 226.6 lb

## 2022-05-26 DIAGNOSIS — R053 Chronic cough: Secondary | ICD-10-CM

## 2022-05-26 DIAGNOSIS — J4531 Mild persistent asthma with (acute) exacerbation: Secondary | ICD-10-CM | POA: Diagnosis not present

## 2022-05-26 LAB — PULMONARY FUNCTION TEST
DL/VA % pred: 113 %
DL/VA: 4.82 ml/min/mmHg/L
DLCO cor % pred: 114 %
DLCO cor: 30.7 ml/min/mmHg
DLCO unc % pred: 114 %
DLCO unc: 30.7 ml/min/mmHg
FEF 25-75 Post: 4.19 L/sec
FEF 25-75 Pre: 3.84 L/sec
FEF2575-%Change-Post: 9 %
FEF2575-%Pred-Post: 145 %
FEF2575-%Pred-Pre: 133 %
FEV1-%Change-Post: 4 %
FEV1-%Pred-Post: 106 %
FEV1-%Pred-Pre: 101 %
FEV1-Post: 3.71 L
FEV1-Pre: 3.53 L
FEV1FVC-%Change-Post: 3 %
FEV1FVC-%Pred-Pre: 110 %
FEV6-%Change-Post: 1 %
FEV6-%Pred-Post: 97 %
FEV6-%Pred-Pre: 95 %
FEV6-Post: 4.29 L
FEV6-Pre: 4.21 L
FEV6FVC-%Change-Post: 0 %
FEV6FVC-%Pred-Post: 104 %
FEV6FVC-%Pred-Pre: 104 %
FVC-%Change-Post: 1 %
FVC-%Pred-Post: 93 %
FVC-%Pred-Pre: 91 %
FVC-Post: 4.29 L
FVC-Pre: 4.22 L
Post FEV1/FVC ratio: 86 %
Post FEV6/FVC ratio: 100 %
Pre FEV1/FVC ratio: 84 %
Pre FEV6/FVC Ratio: 100 %
RV % pred: 109 %
RV: 2.39 L
TLC % pred: 98 %
TLC: 6.69 L

## 2022-05-26 LAB — NITRIC OXIDE: Nitric Oxide: 79

## 2022-05-26 MED ORDER — BUDESONIDE-FORMOTEROL FUMARATE 80-4.5 MCG/ACT IN AERO: 2.0000 | INHALATION_SPRAY | Freq: Two times a day (BID) | RESPIRATORY_TRACT | 5 refills | Status: DC

## 2022-05-26 MED ORDER — PREDNISONE 20 MG PO TABS: 40.0000 mg | ORAL_TABLET | Freq: Every day | ORAL | 0 refills | Status: AC

## 2022-05-26 NOTE — Assessment & Plan Note (Signed)
Mild exacerbation with elevated exhaled nitric oxide. PFTs were normal; however, given elevation in FeNO, flare with season changes/worsening air quality/illness, consistent with asthmatic bronchitis. Recommended we trial ICS/LABA therapy. Tx acute symptoms with prednisone burst. Cough has improved some so shared decision to hold off on empiric abx. Advised he notify us if he does not improve or cough worsens and we will obtain imaging. Verbalized understanding.  Patient Instructions  Start Symbicort 2 puffs Twice daily. Brush tongue and rinse mouth afterwards Continue Albuterol inhaler 2 puffs every 6 hours as needed for shortness of breath, coughing spells or wheezing. Notify if symptoms persist despite rescue inhaler/neb use. Restart Zyrtec 10 mg daily for allergies  Prednisone 40 mg daily for 5 days. Take in AM with food. Mucinex DM Twice daily for cough/congestion Chlorpheniramine tab 4 mg over the counter At bedtime as needed for cough  Follow up in one month with Dr. Loanne Drilling. If symptoms do not improve or worsen, please contact office for sooner follow up or seek emergency care.

## 2022-05-26 NOTE — Patient Instructions (Signed)
Start Symbicort 2 puffs Twice daily. Brush tongue and rinse mouth afterwards Continue Albuterol inhaler 2 puffs every 6 hours as needed for shortness of breath, coughing spells or wheezing. Notify if symptoms persist despite rescue inhaler/neb use. Restart Zyrtec 10 mg daily for allergies  Prednisone 40 mg daily for 5 days. Take in AM with food. Mucinex DM Twice daily for cough/congestion Chlorpheniramine tab 4 mg over the counter At bedtime as needed for cough  Follow up in one month with Dr. Loanne Drilling. If symptoms do not improve or worsen, please contact office for sooner follow up or seek emergency care.

## 2022-05-26 NOTE — Progress Notes (Signed)
$'@Patient'Y$  ID: Charles [name], male    DOB: Jun 11, 1961, 61 y.o.   MRN: [id number]  Chief Complaint  Patient presents with   Follow-up    He is here to go over PFT. He is doing well today.     Referring provider: Leeroy Cha,*  HPI: 61 year old male, never smoker followed for chronic cough.  He is a patient of Dr. Cordelia Pen and last seen in office 04/13/2022.  Past medical history significant for NICM, history of prostate cancer status post radiotherapy 2018, HLD, GERD.  TEST/EVENTS:  05/26/2022 PFTs: FVC 91, FEV1 101, ratio 84, TLC 98, DLCOcor 114.  No significant BD.  Normal PFTs.  04/13/2022: Consult with Dr. Loanne Drilling.  He was referred due to longstanding cough for greater than 20 years.  Previously seen at the [place].  Had pneumonia when he was 18 and since then cough recurs in the winter every year.  Resolves after the winter is over.  Reported that his cough is deep and mild.  Does have some associated shortness of breath and wheezing.  Was not currently having any active symptoms.  Cough seems to be triggered by cold weather and illness.  Previously prescribed cetirizine and omeprazole which she takes on an as-needed basis.  Only notices reflux symptoms with dairy.  Has tried albuterol in the past.  Prefers to minimize medication use is much as possible.  Reviewed common causes of cough including upper airway cough syndrome, reflux and undiagnosed obstructive lung disease.  Recommended that he try PPI if cough develops during other seasons.  PFTs were ordered.  05/26/2022: Today-follow-up Patient presents today for follow-up after undergoing pulmonary function testing.  PFTs were unremarkable with normal function and volumes.  Diffusion capacity was normal.  He had no significant bronchodilator response.  Today, he reports that he has had increased cough over the last week or so. Has had some improvement but not quite back to normal. Feels like he may have a summer cold but doesn't have  any significant nasal drainage/congestion. He does have some chest congestion but it is minimal. Cough is occasionally productive with green to yellow sputum. Very rare wheeze. Breathing is relatively unchanged from baseline. He denies any recent fevers, interim sick exposures, leg swelling. He was previously prescribed zyrtec and omeprazole but rarely uses these.   FeNO 79 ppb  Allergies  Allergen Reactions   Dairy Aid [Tilactase] Other (See Comments)    Creates acid reflex and weakness     Immunization History  Administered Date(s) Administered   Hepatitis B, adult 09/18/2015   Influenza Split 09/18/2014, 08/28/2015, 09/18/2015, 08/29/2016, 09/18/2017, 09/18/2018, 09/18/2020   Influenza,inj,Quad PF,6+ Mos 10/04/2019   Influenza,inj,quad, With Preservative 09/28/2021   Influenza-Unspecified 09/19/2019, 10/01/2020   PNEUMOCOCCAL CONJUGATE-20 09/28/2021   Tdap 10/22/2009, 02/12/2019, 11/27/2019   Zoster Recombinat (Shingrix) 10/31/2019, 01/02/2020   Zoster, Live 10/22/2019    Past Medical History:  Diagnosis Date   GERD (gastroesophageal reflux disease)    Medical history non-contributory    Nonischemic cardiomyopathy ([place]) 12/10/2015   Prostate cancer ([place])     Tobacco History: Social History   Tobacco Use  Smoking Status Never  Smokeless Tobacco Never   Counseling given: Not Answered   Outpatient Medications Prior to Visit  Medication Sig Dispense Refill   Ascorbic Acid (VITAMIN C) 100 MG tablet Take 250 mg by mouth daily. Takes 2 daily     co-enzyme Q-10 50 MG capsule Take 240 mg by mouth daily.     MAGNESIUM CITRATE PO  Take 2 tablets by mouth 2 (two) times daily.     NIACINAMIDE PO Take 2 tablets by mouth 2 (two) times daily.     rosuvastatin (CRESTOR) 10 MG tablet Take by mouth.     Oxycodone HCl 10 MG TABS Take 1 tablet (10 mg total) by mouth every 4 (four) hours as needed. (Patient not taking: Reported on 04/13/2022) 30 tablet 0   No facility-administered  medications prior to visit.     Review of Systems:   Constitutional: No weight loss or gain, night sweats, fevers, chills, fatigue, or lassitude. HEENT: No headaches, difficulty swallowing, tooth/dental problems, or sore throat. No sneezing, itching, ear ache, nasal congestion, or post nasal drip CV:  No chest pain, orthopnea, PND, swelling in lower extremities, anasarca, dizziness, palpitations, syncope Resp: +cough, minimally productive; mild chest congestion; rare wheeze. No shortness of breath with exertion or at rest. No hemoptysis. No chest wall deformity GI:  No heartburn, indigestion, abdominal pain, nausea, vomiting, diarrhea, change in bowel habits, loss of appetite, bloody stools.  Skin: No rash, lesions, ulcerations MSK:  No joint pain or swelling.  No decreased range of motion.  No back pain. Neuro: No dizziness or lightheadedness.  Psych: No depression or anxiety. Mood stable.     Physical Exam:  BP 116/78 (BP Location: Right Arm, Cuff Size: Normal)   Pulse 82   Temp 98.1 F (36.7 C) (Oral)   Ht '5\' 9"'$  (1.753 m)   Wt 226 lb 9.6 oz (102.8 kg)   SpO2 96%   BMI 33.46 kg/m   GEN: Pleasant, interactive, well-appearing; obese; in no acute distress. HEENT:  Normocephalic and atraumatic. PERRLA. Sclera white. Nasal turbinates pink, moist and patent bilaterally. No rhinorrhea present. Oropharynx pink and moist, without exudate or edema. No lesions, ulcerations, or postnasal drip.  NECK:  Supple w/ fair ROM. No JVD present. Normal carotid impulses w/o bruits. Thyroid symmetrical with no goiter or nodules palpated. No lymphadenopathy.   CV: RRR, no m/r/g, no peripheral edema. Pulses intact, +2 bilaterally. No cyanosis, pallor or clubbing. PULMONARY:  Unlabored, regular breathing. Minimal end expiratory wheezes bilaterally A&P. No accessory muscle use. No dullness to percussion. GI: BS present and normoactive. Soft, non-tender to palpation. No organomegaly or masses detected. No  CVA tenderness. MSK: No erythema, warmth or tenderness. Cap refil <2 sec all extrem. No deformities or joint swelling noted.  Neuro: A/Ox3. No focal deficits noted.   Skin: Warm, no lesions or rashe Psych: Normal affect and behavior. Judgement and thought content appropriate.     Lab Results:  CBC    Component Value Date/Time   WBC 5.4 06/14/2017 1001   RBC 4.86 06/14/2017 1001   HGB 14.3 06/14/2017 1001   HCT 40.2 06/14/2017 1001   PLT 207 06/14/2017 1001   MCV 82.7 06/14/2017 1001   MCH 29.4 06/14/2017 1001   MCHC 35.6 06/14/2017 1001   RDW 12.7 06/14/2017 1001    BMET    Component Value Date/Time   NA 139 06/14/2017 1001   K 4.5 06/14/2017 1001   CL 103 06/14/2017 1001   CO2 28 06/14/2017 1001   GLUCOSE 97 06/14/2017 1001   BUN 17 06/14/2017 1001   CREATININE 0.98 06/14/2017 1001   CALCIUM 9.6 06/14/2017 1001   GFRNONAA >60 06/14/2017 1001   GFRAA >60 06/14/2017 1001    BNP No results found for: "BNP"   Imaging:  No results found.       Latest Ref Rng & Units 05/26/2022  11:00 AM  PFT Results  FVC-Pre L 4.22  P  FVC-Predicted Pre % 91  P  FVC-Post L 4.29  P  FVC-Predicted Post % 93  P  Pre FEV1/FVC % % 84  P  Post FEV1/FCV % % 86  P  FEV1-Pre L 3.53  P  FEV1-Predicted Pre % 101  P  FEV1-Post L 3.71  P  DLCO uncorrected ml/min/mmHg 30.70  P  DLCO UNC% % 114  P  DLCO corrected ml/min/mmHg 30.70  P  DLCO COR %Predicted % 114  P  DLVA Predicted % 113  P  TLC L 6.69  P  TLC % Predicted % 98  P  RV % Predicted % 109  P    P Preliminary result    No results found for: "NITRICOXIDE"      Assessment & Plan:   Asthmatic bronchitis, mild persistent, with acute exacerbation Mild exacerbation with elevated exhaled nitric oxide. PFTs were normal; however, given elevation in FeNO, flare with season changes/worsening air quality/illness, consistent with asthmatic bronchitis. Recommended we trial ICS/LABA therapy. Tx acute symptoms with prednisone  burst. Cough has improved some so shared decision to hold off on empiric abx. Advised he notify us if he does not improve or cough worsens and we will obtain imaging. Verbalized understanding.  Patient Instructions  Start Symbicort 2 puffs Twice daily. Brush tongue and rinse mouth afterwards Continue Albuterol inhaler 2 puffs every 6 hours as needed for shortness of breath, coughing spells or wheezing. Notify if symptoms persist despite rescue inhaler/neb use. Restart Zyrtec 10 mg daily for allergies  Prednisone 40 mg daily for 5 days. Take in AM with food. Mucinex DM Twice daily for cough/congestion Chlorpheniramine tab 4 mg over the counter At bedtime as needed for cough  Follow up in one month with Dr. Loanne Drilling. If symptoms do not improve or worsen, please contact office for sooner follow up or seek emergency care.    Chronic cough See above plan. Continue using omeprazole as needed for GERD. Recommended he restart Zyrtec daily for postnasal drainage control and trigger prevention.   I spent 32 minutes of dedicated to the care of this patient on the date of this encounter to include pre-visit review of records, face-to-face time with the patient discussing conditions above, post visit ordering of testing, clinical documentation with the electronic health record, making appropriate referrals as documented, and communicating necessary findings to members of the patients care team.  Clayton Bibles, NP 05/26/2022  Pt aware and understands NP's role.

## 2022-05-26 NOTE — Assessment & Plan Note (Signed)
See above plan. Continue using omeprazole as needed for GERD. Recommended he restart Zyrtec daily for postnasal drainage control and trigger prevention.

## 2022-05-26 NOTE — Progress Notes (Signed)
PFT done today. 

## 2022-07-06 ENCOUNTER — Encounter: Payer: Self-pay | Admitting: Pulmonary Disease

## 2022-07-06 ENCOUNTER — Ambulatory Visit (INDEPENDENT_AMBULATORY_CARE_PROVIDER_SITE_OTHER): Payer: No Typology Code available for payment source | Admitting: Pulmonary Disease

## 2022-07-06 VITALS — BP 112/82 | HR 69 | Ht 68.0 in | Wt 233.8 lb

## 2022-07-06 DIAGNOSIS — R053 Chronic cough: Secondary | ICD-10-CM

## 2022-07-06 MED ORDER — PANTOPRAZOLE SODIUM 40 MG PO TBEC: 40.0000 mg | DELAYED_RELEASE_TABLET | Freq: Every day | ORAL | 3 refills | Status: AC

## 2022-07-06 NOTE — Patient Instructions (Signed)
Chronic cough - improved Chronic bronchitis CONTINUE protonix 40 mg daily. REFILL  Follow-up with me in 1 year

## 2022-07-06 NOTE — Progress Notes (Signed)
Subjective:   PATIENT ID: Charles [name] GENDER: male DOB: 12/25/1960, MRN: [id number]   HPI  Chief Complaint  Patient presents with   Follow-up    Pt f/u still deep coughing, the only thing helping pantoprazole     Reason for Visit: New consult for chronic cough  Charles [name] is a 61 year old male never smoker with NICM, history of prostate cancer s/p radiotherapy 2018, HLD and GERD who presents for follow-up for chronic cough.  Initial consult He reports longstanding cough for >20 years. He had pneumonia when he was 52 and since then cough recurs in the winter (Dec/Jan) every year. Resolves after winter is over. When he does have it, the cough is deep and loud. When he is sick he has associated shortness of breath and wheezing worsened when laying down but not during other times of the year. Currently not having any active symptoms.  Triggered by cold weather and illness. Only cough syrup with codeine helps. He has been prescribed Cetirizine and omeprazole. He takes as needed basis. Only notices reflux with dairy. Denies association with allergies which are actually well-controlled during the winter. He may have tried albuterol when he was ill. He reports breathing tests but not connected to computer. In general, he prefers to minimize medication use possible.  07/06/22 Since our last visit he was seen by Pulmonary NP Cobb for PFT follow-up. PFTs normal however due to symptoms started on Symbicort. No longer taking Symbicort due to ineffectiveness. May have reflux. Advised to take omprazole 20 mg daily x 6 weeks and has noticed resolution of cough.  Social History: Never smoker  Past Medical History:  Diagnosis Date   GERD (gastroesophageal reflux disease)    Medical history non-contributory    Nonischemic cardiomyopathy ([place]) 12/10/2015   Prostate cancer ([place])      Family History  Problem Relation Age of Onset   Cancer Sister        brain tumor     Social  History   Occupational History   Not on file  Tobacco Use   Smoking status: Never   Smokeless tobacco: Never  Vaping Use   Vaping Use: Never used  Substance and Sexual Activity   Alcohol use: No   Drug use: No   Sexual activity: Yes    Allergies  Allergen Reactions   Dairy Aid [Tilactase] Other (See Comments)    Creates acid reflex and weakness      Outpatient Medications Prior to Visit  Medication Sig Dispense Refill   aspirin EC 81 MG tablet Take 81 mg by mouth daily. Swallow whole.     co-enzyme Q-10 50 MG capsule Take 240 mg by mouth daily.     rosuvastatin (CRESTOR) 10 MG tablet Take by mouth.     pantoprazole (PROTONIX) 40 MG tablet Take 40 mg by mouth daily.     Ascorbic Acid (VITAMIN C) 100 MG tablet Take 250 mg by mouth daily. Takes 2 daily (Patient not taking: Reported on 07/06/2022)     MAGNESIUM CITRATE PO Take 2 tablets by mouth 2 (two) times daily. (Patient not taking: Reported on 07/06/2022)     NIACINAMIDE PO Take 2 tablets by mouth 2 (two) times daily. (Patient not taking: Reported on 07/06/2022)     budesonide-formoterol (SYMBICORT) 80-4.5 MCG/ACT inhaler Inhale 2 puffs into the lungs 2 (two) times daily. (Patient not taking: Reported on 07/06/2022) 1 each 5   No facility-administered medications prior to visit.    Review  of Systems  Constitutional:  Negative for chills, diaphoresis, fever, malaise/fatigue and weight loss.  HENT:  Negative for congestion.   Respiratory:  Negative for cough, hemoptysis, sputum production, shortness of breath and wheezing.   Cardiovascular:  Negative for chest pain, palpitations and leg swelling.     Objective:   Vitals:   07/06/22 1031  BP: 112/82  Pulse: 69  SpO2: 94%  Weight: 233 lb 12.8 oz (106.1 kg)  Height: '5\' 8"'$  (1.727 m)    SpO2: 94 %  Physical Exam: General: Well-appearing, no acute distress HENT: [place], AT Eyes: EOMI, no scleral icterus Respiratory: Clear to auscultation bilaterally.  No crackles,  wheezing or rales Cardiovascular: RRR, -M/R/G, no JVD Extremities:-Edema,-tenderness Neuro: AAO x4, CNII-XII grossly intact Psych: Normal mood, normal affect  Data Reviewed:  Imaging: CT coronary 12/17/15 - Visualized lung fields with normal parenchyma CXR 12/28/16 - Normal  PFT: 05/26/22 FVC 4.29 (93%) FEV1 3.71 (106%) Ratio 84  TLC 98% DLCO 114% Interpretation: Normal PFTs  Labs: None on file     Assessment & Plan:   Discussion:  61 year old male never smoker with NICM, hx of prostate cancer s/p radiotherapy 2018, HLD and GERD who presents for follow-up chronic cough. PFTs normal. Tried Symbicort without improvement. PPI daily as resolved cough. If cough recurs, consider trial of gabapentin.  Chronic cough - improved Chronic bronchitis CONTINUE protonix 40 mg daily. REFILL   Health Maintenance Immunization History  Administered Date(s) Administered   Hepatitis B, adult 09/18/2015   Influenza Split 09/18/2014, 08/28/2015, 09/18/2015, 08/29/2016, 09/18/2017, 09/18/2018, 09/18/2020   Influenza,inj,Quad PF,6+ Mos 10/04/2019   Influenza,inj,quad, With Preservative 09/28/2021   Influenza-Unspecified 09/19/2019, 10/01/2020   PNEUMOCOCCAL CONJUGATE-20 09/28/2021   Tdap 10/22/2009, 02/12/2019, 11/27/2019   Zoster Recombinat (Shingrix) 10/31/2019, 01/02/2020   Zoster, Live 10/22/2019    No orders of the defined types were placed in this encounter.  Meds ordered this encounter  Medications   pantoprazole (PROTONIX) 40 MG tablet    Sig: Take 1 tablet (40 mg total) by mouth daily.    Dispense:  90 tablet    Refill:  3    Return in about 1 year (around 07/07/2023).   I have spent a total time of 20-minutes on the day of the appointment including chart review, data review, collecting history, coordinating care and discussing medical diagnosis and plan with the patient/family. Past medical history, allergies, medications were reviewed. Pertinent imaging, labs and tests  included in this note have been reviewed and interpreted independently by me.   [place], MD [facility] 07/10/2022  Office Number [phone]

## 2022-07-10 ENCOUNTER — Encounter: Payer: Self-pay | Admitting: Pulmonary Disease
